# Patient Record
Sex: Female | Born: 1960 | Race: White | Hispanic: No | Smoking: Never smoker
Health system: Southern US, Community
[De-identification: ages and names within clinical notes are randomized; demographics above are authoritative.]

## PROBLEM LIST (undated history)

## (undated) DIAGNOSIS — R87619 Unspecified abnormal cytological findings in specimens from cervix uteri: Secondary | ICD-10-CM

## (undated) DIAGNOSIS — Z9889 Other specified postprocedural states: Secondary | ICD-10-CM

## (undated) DIAGNOSIS — R112 Nausea with vomiting, unspecified: Secondary | ICD-10-CM

## (undated) DIAGNOSIS — N83202 Unspecified ovarian cyst, left side: Secondary | ICD-10-CM

## (undated) DIAGNOSIS — T4145XA Adverse effect of unspecified anesthetic, initial encounter: Secondary | ICD-10-CM

## (undated) DIAGNOSIS — T8859XA Other complications of anesthesia, initial encounter: Secondary | ICD-10-CM

## (undated) DIAGNOSIS — J45909 Unspecified asthma, uncomplicated: Secondary | ICD-10-CM

## (undated) DIAGNOSIS — N939 Abnormal uterine and vaginal bleeding, unspecified: Secondary | ICD-10-CM

## (undated) HISTORY — DX: Abnormal uterine and vaginal bleeding, unspecified: N93.9

## (undated) HISTORY — DX: Unspecified ovarian cyst, left side: N83.202

## (undated) HISTORY — DX: Unspecified abnormal cytological findings in specimens from cervix uteri: R87.619

## (undated) HISTORY — PX: WISDOM TOOTH EXTRACTION: SHX21

## (undated) HISTORY — PX: TUBAL LIGATION: SHX77

---

## 2007-05-22 ENCOUNTER — Other Ambulatory Visit: Admission: RE | Admit: 2007-05-22 | Discharge: 2007-05-22 | Payer: Self-pay | Admitting: Obstetrics and Gynecology

## 2007-05-24 ENCOUNTER — Encounter: Admission: RE | Admit: 2007-05-24 | Discharge: 2007-05-24 | Payer: Self-pay | Admitting: Obstetrics and Gynecology

## 2007-06-05 ENCOUNTER — Encounter: Admission: RE | Admit: 2007-06-05 | Discharge: 2007-06-05 | Payer: Self-pay | Admitting: Obstetrics and Gynecology

## 2007-11-27 ENCOUNTER — Encounter: Admission: RE | Admit: 2007-11-27 | Discharge: 2007-11-27 | Payer: Self-pay | Admitting: Obstetrics and Gynecology

## 2008-05-23 ENCOUNTER — Other Ambulatory Visit: Admission: RE | Admit: 2008-05-23 | Discharge: 2008-05-23 | Payer: Self-pay | Admitting: Obstetrics and Gynecology

## 2008-06-21 ENCOUNTER — Encounter: Admission: RE | Admit: 2008-06-21 | Discharge: 2008-06-21 | Payer: Self-pay | Admitting: Obstetrics and Gynecology

## 2011-12-02 ENCOUNTER — Ambulatory Visit
Admission: RE | Admit: 2011-12-02 | Discharge: 2011-12-02 | Disposition: A | Discharge status: Home / Self Care | Payer: BC Managed Care – PPO | Source: Ambulatory Visit | Attending: Physical Medicine and Rehabilitation | Admitting: Physical Medicine and Rehabilitation

## 2011-12-02 ENCOUNTER — Other Ambulatory Visit: Payer: Self-pay | Admitting: Physical Medicine and Rehabilitation

## 2011-12-02 DIAGNOSIS — G894 Chronic pain syndrome: Secondary | ICD-10-CM

## 2011-12-02 DIAGNOSIS — M47817 Spondylosis without myelopathy or radiculopathy, lumbosacral region: Secondary | ICD-10-CM

## 2011-12-02 DIAGNOSIS — M5137 Other intervertebral disc degeneration, lumbosacral region: Secondary | ICD-10-CM

## 2011-12-02 DIAGNOSIS — M545 Low back pain: Secondary | ICD-10-CM

## 2017-03-08 DIAGNOSIS — R87619 Unspecified abnormal cytological findings in specimens from cervix uteri: Secondary | ICD-10-CM

## 2017-03-08 DIAGNOSIS — N83202 Unspecified ovarian cyst, left side: Secondary | ICD-10-CM

## 2017-03-08 HISTORY — DX: Unspecified ovarian cyst, left side: N83.202

## 2017-03-08 HISTORY — DX: Unspecified abnormal cytological findings in specimens from cervix uteri: R87.619

## 2017-05-27 ENCOUNTER — Ambulatory Visit: Payer: BC Managed Care – PPO | Admitting: Obstetrics and Gynecology

## 2017-05-27 ENCOUNTER — Encounter: Payer: Self-pay | Admitting: Obstetrics and Gynecology

## 2017-05-27 ENCOUNTER — Other Ambulatory Visit (HOSPITAL_COMMUNITY)
Admission: RE | Admit: 2017-05-27 | Discharge: 2017-05-27 | Disposition: A | Discharge status: Home / Self Care | Payer: BC Managed Care – PPO | Source: Ambulatory Visit | Attending: Obstetrics and Gynecology | Admitting: Obstetrics and Gynecology

## 2017-05-27 ENCOUNTER — Other Ambulatory Visit: Payer: Self-pay

## 2017-05-27 VITALS — BP 122/70 | HR 80 | Resp 16 | Ht 64.0 in | Wt 206.8 lb

## 2017-05-27 DIAGNOSIS — N950 Postmenopausal bleeding: Secondary | ICD-10-CM | POA: Diagnosis not present

## 2017-05-27 DIAGNOSIS — Z01419 Encounter for gynecological examination (general) (routine) without abnormal findings: Secondary | ICD-10-CM | POA: Diagnosis not present

## 2017-05-27 DIAGNOSIS — Z113 Encounter for screening for infections with a predominantly sexual mode of transmission: Secondary | ICD-10-CM

## 2017-05-27 DIAGNOSIS — Z1231 Encounter for screening mammogram for malignant neoplasm of breast: Secondary | ICD-10-CM

## 2017-05-27 DIAGNOSIS — Z23 Encounter for immunization: Secondary | ICD-10-CM | POA: Diagnosis not present

## 2017-05-27 NOTE — Progress Notes (Signed)
GYNECOLOGY  VISIT   HPI: 57 y.o.   Divorced  Caucasian  female   G93P2 with Patient's last menstrual period was 11/06/2013 (approximate).   here for postmenopausal bleeding.    Bleeding started 6 days ago and lasted 4 days.  Period cramping. Had breast tenderness the week before.   No other spotting.   She had irregular menses leading up to the final menstruation.  Had heavy cycles prior to this and there was discussion about ablation which the patient did not have.   Hot flashes are now gone.   Former patient of Dr. Joan Flores.   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   GYNECOLOGIC HISTORY: Patient's last menstrual period was 11/06/2013 (approximate). Contraception: Tubal/postmenopausal Menopausal hormone therapy:  none Last mammogram:  Years ago--normal Last pap smear:  Years ago--normal  Sexually active - female partner, no change. Colonoscopy - never.  Last tetanus - 9 - 10 years.  No regular exercise.   OB History    Gravida  2   Para  2   Term      Preterm      AB      Living  2     SAB      TAB      Ectopic      Multiple      Live Births                 There are no active problems to display for this patient.   Past Medical History:  Diagnosis Date  . Abnormal uterine bleeding     Past Surgical History:  Procedure Laterality Date  . TUBAL LIGATION      No current outpatient medications on file.   No current facility-administered medications for this visit.      ALLERGIES: Patient has no known allergies.  Family History  Problem Relation Age of Onset  . Congestive Heart Failure Mother   . Heart attack Father   . Cancer Brother        Dec 56 bile duct CA  . Cancer Maternal Grandmother        ?endometrial cancer  . Diabetes Brother   . Diabetes Brother     Social History   Socioeconomic History  . Marital status: Divorced    Spouse name: Not on file  . Number of children:  Not on file  . Years of education: Not on file  . Highest education level: Not on file  Occupational History  . Not on file  Social Needs  . Financial resource strain: Not on file  . Food insecurity:    Worry: Not on file    Inability: Not on file  . Transportation needs:    Medical: Not on file    Non-medical: Not on file  Tobacco Use  . Smoking status: Never Smoker  . Smokeless tobacco: Never Used  Substance and Sexual Activity  . Alcohol use: Yes    Alcohol/week: 1.2 oz    Types: 2 Glasses of wine per week  . Drug use: Never  . Sexual activity: Yes    Birth control/protection: Surgical, Post-menopausal    Comment: Tubal  Lifestyle  . Physical activity:    Days per week: Not on file    Minutes per session: Not on file  . Stress: Not on file  Relationships  . Social connections:    Talks on phone: Not on file    Gets  together: Not on file    Attends religious service: Not on file    Active member of club or organization: Not on file    Attends meetings of clubs or organizations: Not on file    Relationship status: Not on file  . Intimate partner violence:    Fear of current or ex partner: Not on file    Emotionally abused: Not on file    Physically abused: Not on file    Forced sexual activity: Not on file  Other Topics Concern  . Not on file  Social History Narrative  . Not on file    ROS:  Pertinent items are noted in HPI.  PHYSICAL EXAMINATION:    BP 122/70 (BP Location: Right Arm, Patient Position: Sitting, Cuff Size: Large)   Pulse 80   Resp 16   Ht 5\' 4"  (1.626 m)   Wt 206 lb 12.8 oz (93.8 kg)   LMP 11/06/2013 (Approximate)   BMI 35.50 kg/m     General appearance: alert, cooperative and appears stated age Head: Normocephalic, without obvious abnormality, atraumatic Neck: no adenopathy, supple, symmetrical, trachea midline and thyroid normal to inspection and palpation Lungs: clear to auscultation bilaterally Breasts: normal appearance, no masses  or tenderness, No nipple retraction or dimpling, No nipple discharge or bleeding, No axillary or supraclavicular adenopathy Heart: regular rate and rhythm Abdomen: soft, non-tender, no masses,  no organomegaly Extremities: extremities normal, atraumatic, no cyanosis or edema Skin: Skin color, texture, turgor normal. No rashes or lesions Lymph nodes: Cervical, supraclavicular, and axillary nodes normal. No abnormal inguinal nodes palpated Neurologic: Grossly normal  Pelvic: External genitalia:  no lesions              Urethra:  normal appearing urethra with no masses, tenderness or lesions              Bartholins and Skenes: normal                 Vagina: normal appearing vagina with tan colored vaginal drainage, no lesions              Cervix: no lesions.  Pap done.                 Bimanual Exam:  Uterus:  normal size, contour, position, consistency, mobility, non-tender              Adnexa: no mass, fullness, tenderness              Rectal exam: Yes.  .  Confirms.              Anus:  normal sphincter tone, no lesions  Chaperone was present for exam.  ASSESSMENT  Well woman visit with abnormal findings.  Postmenopausal bleeding.  FH uterine cancer.   PLAN  Discussed postmenopausal bleeding.  Routine labs, FSH, estradiol, hep C screening. Return for pelvic US and EMB.  She understands the importance to rule out cancer.  TDap today.  She declines colonoscopy.  Ca/Vit D guidelines. We will schedule a mammogram.    An After Visit Summary was printed and given to the patient.

## 2017-05-27 NOTE — Progress Notes (Signed)
Patient scheduled while in office for PUS with EMB on 06/02/17 at 9:30am, consult to follow at 10am with Dr. Quincy Simmonds. Advised to take Motrin 800 mg with food and water one hour before procedure.  Call placed to [facility], spoke with Manus Gunning. Patient scheduled for 2D screening MMG on 06/14/17 arriving at 3:40pm for 4pm appointment. Patient declined 3D screening. Patient verbalizes understanding and is agreeable.

## 2017-05-27 NOTE — Patient Instructions (Addendum)
EXERCISE AND DIET:  We recommended that you start or continue a regular exercise program for good health. Regular exercise means any activity that makes your heart beat faster and makes you sweat.  We recommend exercising at least 30 minutes per day at least 3 days a week, preferably 4 or 5.  We also recommend a diet low in fat and sugar.  Inactivity, poor dietary choices and obesity can cause diabetes, heart attack, stroke, and kidney damage, among others.    ALCOHOL AND SMOKING:  Women should limit their alcohol intake to no more than 7 drinks/beers/glasses of wine (combined, not each!) per week. Moderation of alcohol intake to this level decreases your risk of breast cancer and liver damage. And of course, no recreational drugs are part of a healthy lifestyle.  And absolutely no smoking or even second hand smoke. Most people know smoking can cause heart and lung diseases, but did you know it also contributes to weakening of your bones? Aging of your skin?  Yellowing of your teeth and nails?  CALCIUM AND VITAMIN D:  Adequate intake of calcium and Vitamin D are recommended.  The recommendations for exact amounts of these supplements seem to change often, but generally speaking 600 mg of calcium (either carbonate or citrate) and 800 units of Vitamin D per day seems prudent. Certain women may benefit from higher intake of Vitamin D.  If you are among these women, your doctor will have told you during your visit.    PAP SMEARS:  Pap smears, to check for cervical cancer or precancers,  have traditionally been done yearly, although recent scientific advances have shown that most women can have pap smears less often.  However, every woman still should have a physical exam from her gynecologist every year. It will include a breast check, inspection of the vulva and vagina to check for abnormal growths or skin changes, a visual exam of the cervix, and then an exam to evaluate the size and shape of the uterus and  ovaries.  And after 55 years of age, a rectal exam is indicated to check for rectal cancers. We will also provide age appropriate advice regarding health maintenance, like when you should have certain vaccines, screening for sexually transmitted diseases, bone density testing, colonoscopy, mammograms, etc.   MAMMOGRAMS:  All women over 40 years old should have a yearly mammogram. Many facilities now offer a "3D" mammogram, which may cost around $50 extra out of pocket. If possible,  we recommend you accept the option to have the 3D mammogram performed.  It both reduces the number of women who will be called back for extra views which then turn out to be normal, and it is better than the routine mammogram at detecting truly abnormal areas.    COLONOSCOPY:  Colonoscopy to screen for colon cancer is recommended for all women at age 50.  We know, you hate the idea of the prep.  We agree, BUT, having colon cancer and not knowing it is worse!!  Colon cancer so often starts as a polyp that can be seen and removed at colonscopy, which can quite literally save your life!  And if your first colonoscopy is normal and you have no family history of colon cancer, most women don't have to have it again for 10 years.  Once every ten years, you can do something that may end up saving your life, right?  We will be happy to help you get it scheduled when you are ready.    Be sure to check your insurance coverage so you understand how much it will cost.  It may be covered as a preventative service at no cost, but you should check your particular policy.     Postmenopausal Bleeding Postmenopausal bleeding is any bleeding a woman has after she has entered into menopause. Menopause is the end of a woman's fertile years. After menopause, a woman no longer ovulates or has menstrual periods. Postmenopausal bleeding can be caused by various things. Any type of postmenopausal bleeding, even if it appears to be a typical menstrual  period, is concerning. This should be evaluated by your health care provider. Any treatment will depend on the cause of the bleeding. Follow these instructions at home: Monitor your condition for any changes. The following actions may help to alleviate any discomfort you are experiencing:  Avoid the use of tampons and douches as directed by your health care provider.  Change your pads frequently.  Get regular pelvic exams and Pap tests.  Keep all follow-up appointments for diagnostic tests as directed by your health care provider.  Contact a health care provider if:  Your bleeding lasts more than 1 week.  You have abdominal pain.  You have bleeding with sexual intercourse. Get help right away if:  You have a fever, chills, headache, dizziness, muscle aches, and bleeding.  You have severe pain with bleeding.  You are passing blood clots.  You have bleeding and need more than 1 pad an hour.  You feel faint. This information is not intended to replace advice given to you by your health care provider. Make sure you discuss any questions you have with your health care provider. Document Released: 06/02/2005 Document Revised: 07/31/2015 Document Reviewed: 09/21/2012 Elsevier Interactive Patient Education  [employer].

## 2017-05-28 LAB — CBC
HEMATOCRIT: 38.8 % (ref 34.0–46.6)
Hemoglobin: 12.3 g/dL (ref 11.1–15.9)
MCH: 29.5 pg (ref 26.6–33.0)
MCHC: 31.7 g/dL (ref 31.5–35.7)
MCV: 93 fL (ref 79–97)
Platelets: 315 10*3/uL (ref 150–379)
RBC: 4.17 x10E6/uL (ref 3.77–5.28)
RDW: 14.5 % (ref 12.3–15.4)
WBC: 6.1 10*3/uL (ref 3.4–10.8)

## 2017-05-28 LAB — LIPID PANEL
Chol/HDL Ratio: 2.6 ratio (ref 0.0–4.4)
Cholesterol, Total: 207 mg/dL — ABNORMAL HIGH (ref 100–199)
HDL: 80 mg/dL (ref >39)
LDL Calculated: 106 mg/dL — ABNORMAL HIGH (ref 0–99)
TRIGLYCERIDES: 106 mg/dL (ref 0–149)
VLDL Cholesterol Cal: 21 mg/dL (ref 5–40)

## 2017-05-28 LAB — COMPREHENSIVE METABOLIC PANEL
A/G RATIO: 1.7 (ref 1.2–2.2)
ALK PHOS: 60 IU/L (ref 39–117)
ALT: 16 IU/L (ref 0–32)
AST: 18 IU/L (ref 0–40)
Albumin: 4.3 g/dL (ref 3.5–5.5)
BUN/Creatinine Ratio: 27 — ABNORMAL HIGH (ref 9–23)
BUN: 17 mg/dL (ref 6–24)
CO2: 25 mmol/L (ref 20–29)
Calcium: 8.8 mg/dL (ref 8.7–10.2)
Chloride: 105 mmol/L (ref 96–106)
Creatinine, Ser: 0.64 mg/dL (ref 0.57–1.00)
GFR calc Af Amer: 116 mL/min/{1.73_m2} (ref >59)
GFR calc non Af Amer: 101 mL/min/{1.73_m2} (ref >59)
GLOBULIN, TOTAL: 2.6 g/dL (ref 1.5–4.5)
Glucose: 89 mg/dL (ref 65–99)
POTASSIUM: 4.2 mmol/L (ref 3.5–5.2)
Sodium: 143 mmol/L (ref 134–144)
Total Protein: 6.9 g/dL (ref 6.0–8.5)

## 2017-05-28 LAB — ESTRADIOL: ESTRADIOL: 5.1 pg/mL

## 2017-05-28 LAB — HEPATITIS C ANTIBODY: Hep C Virus Ab: <0.1 s/co ratio (ref 0.0–0.9)

## 2017-05-28 LAB — TSH: TSH: 2.88 u[IU]/mL (ref 0.450–4.500)

## 2017-05-28 LAB — FOLLICLE STIMULATING HORMONE: FSH: 71.8 m[IU]/mL

## 2017-05-29 DIAGNOSIS — N950 Postmenopausal bleeding: Secondary | ICD-10-CM | POA: Insufficient documentation

## 2017-05-31 LAB — CYTOLOGY - PAP: HPV (WINDOPATH): NOT DETECTED

## 2017-06-01 ENCOUNTER — Telehealth: Payer: Self-pay

## 2017-06-01 NOTE — Telephone Encounter (Signed)
-----   Message from Nunzio Cobbs, MD sent at 05/29/2017 11:07 AM EDT ----- Please inform patient of results.  Her hormone testing indicates menopause, so it is important that she follow up as scheduled for her pelvic ultrasound and endometrial biopsy appointment.  Her thyroid, blood counts, and metabolic profile are all ok.  Hep C testing is negative.  Her cholesterol panel shows minimally elevated LDL cholesterol but her cholesterol ratios are good.  She can lower cholesterol through vigorous exercise and a diet low in saturated fats and cholesterol.

## 2017-06-01 NOTE — Telephone Encounter (Signed)
Left message to call Kaitlyn at [phone]. 

## 2017-06-01 NOTE — Telephone Encounter (Signed)
Spoke with patient. Results given. Patient verbalizes understanding. Encounter closed. 

## 2017-06-01 NOTE — Telephone Encounter (Signed)
Patient returned call to Kaitlyn. °

## 2017-06-02 ENCOUNTER — Ambulatory Visit (INDEPENDENT_AMBULATORY_CARE_PROVIDER_SITE_OTHER): Payer: BC Managed Care – PPO

## 2017-06-02 ENCOUNTER — Other Ambulatory Visit: Payer: Self-pay

## 2017-06-02 ENCOUNTER — Ambulatory Visit: Payer: BC Managed Care – PPO | Admitting: Obstetrics and Gynecology

## 2017-06-02 ENCOUNTER — Encounter: Payer: Self-pay | Admitting: Obstetrics and Gynecology

## 2017-06-02 VITALS — BP 112/80 | HR 72 | Resp 14 | Ht 64.0 in | Wt 206.0 lb

## 2017-06-02 DIAGNOSIS — N83202 Unspecified ovarian cyst, left side: Secondary | ICD-10-CM | POA: Diagnosis not present

## 2017-06-02 DIAGNOSIS — N950 Postmenopausal bleeding: Secondary | ICD-10-CM

## 2017-06-02 DIAGNOSIS — R87619 Unspecified abnormal cytological findings in specimens from cervix uteri: Secondary | ICD-10-CM | POA: Diagnosis not present

## 2017-06-02 NOTE — Progress Notes (Signed)
Encounter reviewed by Dr. Brallan Denio Amundson C. Silva.  

## 2017-06-02 NOTE — Progress Notes (Signed)
Patient scheduled while in office for colposcopy on 06/06/2017 at 3:30 pm with Dr.Silva. Patient is agreeable to date and time.

## 2017-06-02 NOTE — Progress Notes (Signed)
GYNECOLOGY  VISIT   HPI: 57 y.o.   Divorced  Caucasian  female   G61P2 with Patient's last menstrual period was 11/06/2013 (approximate).   here for ultrasound/EMB.  Postmenopausal bleeding.    GYNECOLOGIC HISTORY: Patient's last menstrual period was 11/06/2013 (approximate). Contraception:  Tubal/postmenopausal Menopausal hormone therapy:  none Last mammogram:  06/21/08 BIRADS 1 negative -- scheduled 06/14/17  Last pap smear:   05/27/17 AGUS and Endometrial cells present; HR HPV not detected        OB History    Gravida  2   Para  2   Term      Preterm      AB      Living  2     SAB      TAB      Ectopic      Multiple      Live Births                 Patient Active Problem List   Diagnosis Date Noted  . Postmenopausal bleeding 05/29/2017    Past Medical History:  Diagnosis Date  . Abnormal uterine bleeding     Past Surgical History:  Procedure Laterality Date  . TUBAL LIGATION      No current outpatient medications on file.   No current facility-administered medications for this visit.      ALLERGIES: Patient has no known allergies.  Family History  Problem Relation Age of Onset  . Congestive Heart Failure Mother   . Heart attack Father   . Cancer Brother        Dec 56 bile duct CA  . Cancer Maternal Grandmother        ?endometrial cancer  . Diabetes Brother   . Diabetes Brother     Social History   Socioeconomic History  . Marital status: Divorced    Spouse name: Not on file  . Number of children: Not on file  . Years of education: Not on file  . Highest education level: Not on file  Occupational History  . Not on file  Social Needs  . Financial resource strain: Not on file  . Food insecurity:    Worry: Not on file    Inability: Not on file  . Transportation needs:    Medical: Not on file    Non-medical: Not on file  Tobacco Use  . Smoking status: Never Smoker  . Smokeless tobacco: Never Used  Substance and Sexual  Activity  . Alcohol use: Yes    Alcohol/week: 1.2 oz    Types: 2 Glasses of wine per week  . Drug use: Never  . Sexual activity: Yes    Birth control/protection: Surgical, Post-menopausal    Comment: Tubal  Lifestyle  . Physical activity:    Days per week: Not on file    Minutes per session: Not on file  . Stress: Not on file  Relationships  . Social connections:    Talks on phone: Not on file    Gets together: Not on file    Attends religious service: Not on file    Active member of club or organization: Not on file    Attends meetings of clubs or organizations: Not on file    Relationship status: Not on file  . Intimate partner violence:    Fear of current or ex partner: Not on file    Emotionally abused: Not on file    Physically abused: Not on file  Forced sexual activity: Not on file  Other Topics Concern  . Not on file  Social History Narrative  . Not on file    ROS:  Pertinent items are noted in HPI.  PHYSICAL EXAMINATION:    BP 112/80 (BP Location: Right Arm, Patient Position: Sitting, Cuff Size: Large)   Pulse 72   Resp 14   Ht 5\' 4"  (1.626 m)   Wt 206 lb (93.4 kg)   LMP 11/06/2013 (Approximate)   BMI 35.36 kg/m     General appearance: alert, cooperative and appears stated age  Pelvic US Uterus with no myometrial masses. 9.02 mm EMS and cystic in nature. Right ovary with follicle.  Left ovary with cyst 63 x 39 x 40 mm and thickened septation 5 mm, avascular. No free fluid.   EMB performed under sterile conditions after consent and Hibiclens prep.  Tenaculum to anterior cx lip.  Os finder used.  Pipelle passed to almost 8 cm twice.  Tissue to pathology.  No complications.  Minimal EBL.   Chaperone was present for exam.  ASSESSMENT  Postmenopausal bleeding.  Thickened endometrium. AGUS pap. Left ovarian cyst with thickened septation.  PLAN  [facility] EMB.  Precautions given following EMB. CA125.  Discussed ultrasound findings of thickened  endometrium and left ovarian cyst. Discussed AGUS pap. Return for colposcopy.    An After Visit Summary was printed and given to the patient.  ___15___ minutes face to face time of which over 50% was spent in counseling.

## 2017-06-02 NOTE — Patient Instructions (Signed)
Endometrial Biopsy, Care After This sheet gives you information about how to care for yourself after your procedure. Your health care provider may also give you more specific instructions. If you have problems or questions, contact your health care provider. What can I expect after the procedure? After the procedure, it is common to have:  Mild cramping.  A small amount of vaginal bleeding for a few days. This is normal.  Follow these instructions at home:  Take over-the-counter and prescription medicines only as told by your health care provider.  Do not douche, use tampons, or have sexual intercourse until your health care provider approves.  Return to your normal activities as told by your health care provider. Ask your health care provider what activities are safe for you.  Follow instructions from your health care provider about any activity restrictions, such as restrictions on strenuous exercise or heavy lifting. Contact a health care provider if:  You have heavy bleeding, or bleed for longer than 2 days after the procedure.  You have bad smelling discharge from your vagina.  You have a fever or chills.  You have a burning sensation when urinating or you have difficulty urinating.  You have severe pain in your lower abdomen. Get help right away if:  You have severe cramps in your stomach or back.  You pass large blood clots.  Your bleeding increases.  You become weak or light-headed, or you pass out. Summary  After the procedure, it is common to have mild cramping and a small amount of vaginal bleeding for a few days.  Do not douche, use tampons, or have sexual intercourse until your health care provider approves.  Return to your normal activities as told by your health care provider. Ask your health care provider what activities are safe for you. This information is not intended to replace advice given to you by your health care provider. Make sure you discuss any  questions you have with your health care provider. Document Released: 12/13/2012 Document Revised: 03/10/2016 Document Reviewed: 03/10/2016 Elsevier Interactive Patient Education  2017 [place].   Colposcopy Colposcopy is a procedure to examine the lowest part of the uterus (cervix), the vagina, and the area around the vaginal opening (vulva) for abnormalities or signs of disease. The procedure is done using a lighted microscope or magnifying lens (colposcope). If any unusual cells are found during the procedure, your health care provider may remove a tissue sample for testing (biopsy). A colposcopy may be done if you:  Have an abnormal Pap test. A Pap test is a screening test that is used to check for signs of cancer or infection of the vagina, cervix, and uterus.  Have a Pap smear test in which you test positive for high-risk HPV (human papillomavirus).  Have a sore or lesion on your cervix.  Have genital warts on your vulva, vagina, or cervix.  Took certain medicines while pregnant, such as diethylstilbestrol (DES).  Have pain during sexual intercourse.  Have vaginal bleeding, especially after sexual intercourse.  Need to have a cervical polyp removed.  Need to have a lost intrauterine device (IUD) string located.  Let your health care provider know about:  Any allergies you have, including allergies to prescribed medicine, latex, or iodine.  All medicines you are taking, including vitamins, herbs, eye drops, creams, and over-the-counter medicines. Bring a list of all of your medicines to your appointment.  Any problems you or family members have had with anesthetic medicines.  Any blood disorders  you have.  Any surgeries you have had.  Any medical conditions you have, such as pelvic inflammatory disease (PID) or endometrial disorder.  Any history of frequent fainting.  Your menstrual cycle and what form of birth control (contraception) you use.  Your medical  history, including any prior cervical treatment.  Whether you are pregnant or may be pregnant. What are the risks? Generally, this is a safe procedure. However, problems may occur, including:  Pain.  Infection, which may include a fever, bad-smelling discharge, or pelvic pain.  Bleeding or discharge.  Misdiagnosis.  Fainting and vasovagal reactions, but this is rare.  Allergic reactions to medicines.  Damage to other structures or organs.  What happens before the procedure?  If you have your menstrual period or will have it at the time of your procedure, tell your health care provider. A colposcopy typically is not done during menstruation.  Continue your contraceptive practices before and after the procedure.  For 24 hours before the colposcopy: ? Do not douche. ? Do not use tampons. ? Do not use medicines, creams, or suppositories in the vagina. ? Do not have sexual intercourse.  Ask your health care provider about: ? Changing or stopping your regular medicines. This is especially important if you are taking diabetes medicines or blood thinners. ? Taking medicines such as aspirin and ibuprofen. These medicines can thin your blood. Do not take these medicines before your procedure if your health care provider instructs you not to. It is likely that your health care provider will tell you to avoid taking aspirin or medicine that contains aspirin for 7 days before the procedure.  Follow instructions from your health care provider about eating or drinking restrictions. You will likely need to eat a regular diet the day of the procedure and not skip any meals.  You may have an exam or testing. A pregnancy test will be taken on the day of the procedure.  You may have a blood or urine sample taken.  Plan to have someone take you home from the hospital or clinic.  If you will be going home right after the procedure, plan to have someone with you for 24 hours. What happens  during the procedure?  You will lie down on your back, with your feet in foot rests (stirrups).  A warmed and lubricated instrument (speculum) will be inserted into your vagina. The speculum will be used to hold apart the walls of your vagina so your health care provider can see your cervix and the inside of your vagina.  A cotton swab will be used to place a small amount of liquid solution on the areas to be examined. This solution makes it easier to see abnormal cells. You may feel a slight burning during this part.  The colposcope will be used to scan the cervix with a bright white light. The colposcope will be held near your vulvaand will magnify your vulva, vagina, and cervix for easier examination.  Your health care provider may decide to take a biopsy. If so: ? You may be given medicine to numb the area (local anesthetic). ? Surgical instruments will be used to suck out mucus and cells through your vagina. ? You may feel mild pain while the tissue sample is removed. ? Bleeding may occur. A solution may be used to stop the bleeding. ? If a sample of tissue is needed from the inside of the cervix, a different procedure called endocervical curettage (ECC) may be completed. During this  procedure, a curved instrument (curette) will be used to scrape cells from your cervix or the top of your cervix (endocervix).  Your health care provider will record the location of any abnormalities. The procedure may vary among health care providers and hospitals. What happens after the procedure?  You will lie down and rest for a few minutes. You may be offered juice or cookies.  Your blood pressure, heart rate, breathing rate, and blood oxygen level will be monitored until any medicines you were given have worn off.  You may have to wear compression stockings. These stockings help to prevent blood clots and reduce swelling in your legs.  You may have some cramping in your abdomen. This should go away  after a few minutes. This information is not intended to replace advice given to you by your health care provider. Make sure you discuss any questions you have with your health care provider. Document Released: 05/15/2002 Document Revised: 10/21/2015 Document Reviewed: 09/29/2015 Elsevier Interactive Patient Education  [employer].

## 2017-06-03 LAB — CA 125: Cancer Antigen (CA) 125: 12.4 U/mL (ref 0.0–38.1)

## 2017-06-06 ENCOUNTER — Encounter: Payer: Self-pay | Admitting: Obstetrics and Gynecology

## 2017-06-06 ENCOUNTER — Ambulatory Visit: Payer: BC Managed Care – PPO | Admitting: Obstetrics and Gynecology

## 2017-06-06 ENCOUNTER — Other Ambulatory Visit: Payer: Self-pay

## 2017-06-06 DIAGNOSIS — R87619 Unspecified abnormal cytological findings in specimens from cervix uteri: Secondary | ICD-10-CM

## 2017-06-06 NOTE — Patient Instructions (Signed)
Colposcopy, Care After  This sheet gives you information about how to care for yourself after your procedure. Your doctor may also give you more specific instructions. If you have problems or questions, contact your doctor.  What can I expect after the procedure?  If you did not have a tissue sample removed (did not have a biopsy), you may only have some spotting for a few days. You can go back to your normal activities.  If you had a tissue sample removed, it is common to have:  · Soreness and pain. This may last for a few days.  · Light-headedness.  · Mild bleeding from your vagina or dark-colored, grainy discharge from your vagina. This may last for a few days. You may need to wear a sanitary pad.  · Spotting for at least 48 hours after the procedure.    Follow these instructions at home:  · Take over-the-counter and prescription medicines only as told by your doctor. Ask your doctor what medicines you can start taking again. This is very important if you take blood-thinning medicine.  · Do not drive or use heavy machinery while taking prescription pain medicine.  · For 3 days, or as long as your doctor tells you, avoid:  ? Douching.  ? Using tampons.  ? Having sex.  · If you use birth control (contraception), keep using it.  · Limit activity for the first day after the procedure. Ask your doctor what activities are safe for you.  · It is up to you to get the results of your procedure. Ask your doctor when your results will be ready.  · Keep all follow-up visits as told by your doctor. This is important.  Contact a doctor if:  · You get a skin rash.  Get help right away if:  · You are bleeding a lot from your vagina. It is a lot of bleeding if you are using more than one pad an hour for 2 hours in a row.  · You have clumps of blood (blood clots) coming from your vagina.  · You have a fever.  · You have chills  · You have pain in your lower belly (pelvic area).  · You have signs of infection, such as vaginal  discharge that is:  ? Different than usual.  ? Yellow.  ? Bad-smelling.  · You have very pain or cramps in your lower belly that do not get better with medicine.  · You feel light-headed.  · You feel dizzy.  · You pass out (faint).  Summary  · If you did not have a tissue sample removed (did not have a biopsy), you may only have some spotting for a few days. You can go back to your normal activities.  · If you had a tissue sample removed, it is common to have mild pain and spotting for 48 hours.  · For 3 days, or as long as your doctor tells you, avoid douching, using tampons and having sex.  · Get help right away if you have bleeding, very bad pain, or signs of infection.  This information is not intended to replace advice given to you by your health care provider. Make sure you discuss any questions you have with your health care provider.  Document Released: 08/11/2007 Document Revised: 11/12/2015 Document Reviewed: 11/12/2015  Elsevier Interactive Patient Education © 2018 Elsevier Inc[employer]

## 2017-06-06 NOTE — Progress Notes (Signed)
Subjective:     Patient ID: Alyssa Chavez, female   DOB: Jul 13, 1960, 57 y.o.   MRN: [id number]  HPI  Pap History: 05/27/17 AGUS and Endometrial cells present; HR HPV not detected.  Currently also being seen for postmenopausal bleeding. EMB benign polypoid endometrium.  Pelvic US showing thickened endometrium, 9.02 mm and cystic, and a left ovarian cyst 63 x 39 x 40 mm with thickened 5 mm septation, avascular.  Normal CA125.   Review of Systems LMP: 11/06/13 Contraception: Tubal/contraception Patient states that she has taken 800mg  of Ibuprofen around 2:30pm     Objective:   Physical Exam  Genitourinary:    Colposcopy Consent for procedure.  3% acetic acid used.  Bleeding from the right cervix at 9:00 just inside the os making it difficult to do a satisfactory colposcopy even with use of endocervical specimen. White light and Wence light filter used.  ECC taken and sent to pathology.  Lugol's applied and had decreased uptake along lower cervix.   Biopsy at 6:00 exocervix sent to pathology.  Biopsy at 9:00 exocervix sent to pathology.  Monsel's applied.  AgNO3 applied to cervix at 9:00. Minimal EBL.  No complications.       Assessment:     AGUS pap.  Negative HR HPV.  Postmenopausal bleeding.  Thickened endometrium. Left ovarian cyst with thickened septation.  Normal CA125.      Plan:     Follow up biopsy results.  Instructions/precautions given.  We discussed potential for future surgical care - LEEP, hysteroscopy, oophorectomy, and even hysterectomy.  We will put together a comprehensive plan when the colposcopy results are available. She understands that there are protocols for proper evaluation of all of these diagnoses.  After visit summary to patient.

## 2017-06-10 ENCOUNTER — Encounter: Payer: Self-pay | Admitting: Obstetrics and Gynecology

## 2017-06-14 ENCOUNTER — Ambulatory Visit
Admission: RE | Admit: 2017-06-14 | Discharge: 2017-06-14 | Disposition: A | Discharge status: Home / Self Care | Payer: BC Managed Care – PPO | Source: Ambulatory Visit | Attending: Obstetrics and Gynecology | Admitting: Obstetrics and Gynecology

## 2017-06-14 DIAGNOSIS — Z1231 Encounter for screening mammogram for malignant neoplasm of breast: Secondary | ICD-10-CM

## 2017-06-15 ENCOUNTER — Encounter: Payer: Self-pay | Admitting: Obstetrics and Gynecology

## 2017-06-15 ENCOUNTER — Ambulatory Visit: Payer: BC Managed Care – PPO | Admitting: Obstetrics and Gynecology

## 2017-06-15 ENCOUNTER — Other Ambulatory Visit: Payer: Self-pay

## 2017-06-15 VITALS — BP 112/64 | HR 66 | Ht 64.0 in | Wt 208.0 lb

## 2017-06-15 DIAGNOSIS — N870 Mild cervical dysplasia: Secondary | ICD-10-CM | POA: Diagnosis not present

## 2017-06-15 DIAGNOSIS — N950 Postmenopausal bleeding: Secondary | ICD-10-CM

## 2017-06-15 DIAGNOSIS — N83202 Unspecified ovarian cyst, left side: Secondary | ICD-10-CM | POA: Diagnosis not present

## 2017-06-15 DIAGNOSIS — N840 Polyp of corpus uteri: Secondary | ICD-10-CM | POA: Diagnosis not present

## 2017-06-15 NOTE — Patient Instructions (Signed)
I will be in touch with you after I contact Dr. Everitt Amber about your potential surgical care.

## 2017-06-15 NOTE — Progress Notes (Signed)
GYNECOLOGY  VISIT   HPI: 57 y.o.   Divorced  Caucasian  female   G71P2 with Patient's last menstrual period was 11/06/2013 (approximate).   here for consult.  No further vaginal bleeding.   Has completed a work up for postmenopausal bleeding and AGUS pap showing endometrial cells. US showed cystic endometrium and left ovarian cyst 63 x 39 x 40 mm with multiple septations, thickest 5 mm and scant debris noted.  CA125 12.4. EMB benign polypoid endometrium. Colpo biopsies benign ECC and bx showing atypia.  One area of cervix at 9:00 transformation zone not seen well due to bleeding at colpo.  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  Works as a Engineer, site.  No lifting.   GYNECOLOGIC HISTORY: Patient's last menstrual period was 11/06/2013 (approximate). Contraception:  Tubal Menopausal hormone therapy:  none Last mammogram:  06/14/17 - not resulted yet Last pap smear:  06/06/17 Colposcopy showing atypia, HPV effect, and low grad dysplasia; 3/22/19AGUS andEndometrial cells present; HR HPV not detected         OB History    Gravida  2   Para  2   Term      Preterm      AB      Living  2     SAB      TAB      Ectopic      Multiple      Live Births                 Patient Active Problem List   Diagnosis Date Noted  . 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 Left ovarian cyst 06/02/2017  . Postmenopausal bleeding 05/29/2017    Past Medical History:  Diagnosis Date  . Abnormal uterine bleeding   . 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 Left ovarian cyst 2019   septations    Past Surgical History:  Procedure Laterality Date  . TUBAL LIGATION      No current outpatient medications on file.   No current facility-administered medications for this visit.      ALLERGIES:  Patient has no known allergies.  Family History  Problem Relation Age of Onset  . Congestive Heart Failure Mother   . Heart attack Father   . Cancer Brother        Dec 56 bile duct CA  . Cancer Maternal Grandmother        ?endometrial cancer  . Diabetes Brother   . Diabetes Brother     Social History   Socioeconomic History  . Marital status: Divorced    Spouse name: Not on file  . Number of children: Not on file  . Years of education: Not on file  . Highest education level: Not on file  Occupational History  . Not on file  Social Needs  . Financial resource strain: Not on file  . Food insecurity:    Worry: Not on file    Inability: Not on file  . Transportation needs:    Medical: Not on file    Non-medical: Not on file  Tobacco Use  . Smoking status: Never Smoker  . Smokeless tobacco: Never Used  Substance and Sexual Activity  . Alcohol use: Yes    Alcohol/week: 1.2 oz    Types: 2 Glasses of wine per week  . Drug use:  Never  . Sexual activity: Yes    Birth control/protection: Surgical, Post-menopausal    Comment: Tubal  Lifestyle  . Physical activity:    Days per week: Not on file    Minutes per session: Not on file  . Stress: Not on file  Relationships  . Social connections:    Talks on phone: Not on file    Gets together: Not on file    Attends religious service: Not on file    Active member of club or organization: Not on file    Attends meetings of clubs or organizations: Not on file    Relationship status: Not on file  . Intimate partner violence:    Fear of current or ex partner: Not on file    Emotionally abused: Not on file    Physically abused: Not on file    Forced sexual activity: Not on file  Other Topics Concern  . Not on file  Social History Narrative  . Not on file    ROS:  Pertinent items are noted in HPI.  PHYSICAL EXAMINATION:    BP 112/64 (BP Location: Right Arm, Patient Position: Sitting, Cuff Size: Large)   Pulse 66   Ht  5\' 4"  (1.626 m)   Wt 208 lb (94.3 kg)   LMP 11/06/2013 (Approximate)   BMI 35.70 kg/m     General appearance: alert, cooperative and appears stated age  ASSESSMENT  Postmenopausal bleeding.   Benign polypoid endometrium. AGUS pap.  Left ovarian cyst with septations.  Normal CA125.  MGM with hx uterine cancer?  PLAN  We reviewed her work up and care options:  Hysteroscopy with dilation and curettage and laparoscopic left salpingo-oophorectomy and pelvic washings, robotic total laparoscopic hysterectomy with bilateral salpingo-oophorectomy, or referral to [facility] for evaluation and treatment.  Patient would like to proceed with definitive surgical care.  She understands if I were to do surgery and she were to have a hidden cancer, that she could need additional surgical staging.  I will reach out to Dr. Everitt Amber from [facility] to get her opinion about proceeding forward.  Written information about hysterectomy and robotic surgery to patient.   An After Visit Summary was printed and given to the patient.  __25____ minutes face to face time of which over 50% was spent in counseling.

## 2017-06-23 ENCOUNTER — Telehealth: Payer: Self-pay | Admitting: Obstetrics and Gynecology

## 2017-06-23 NOTE — Telephone Encounter (Signed)
Call to patient. Advised of update from Dr Quincy Simmonds and Dr Denman George. Patient desires to proceed with surgery with Dr Quincy Simmonds. Unsure when she can schedule. Partner's father passed away suddenly and she has had to travel out of state.  She is with boyfriend/partner and is unsure how long it will be until they can come home and schedule. Discussed need to proceed without prolonged delay due to nature of PMB.  Discussed date of 07-12-17 and she will consider this and call back beginning of week with update and possible plan.

## 2017-06-23 NOTE — Telephone Encounter (Signed)
Please contact patient in follow up to Dr. Serita Grit message which is attached. Dr. Denman George agrees that cancer risk is low but not zero. She may proceed with me performing her surgery.   I am recommending we proceed with a total laparoscopic hysterectomy with bilateral salpingo-oophorectomy, collection of pelvic washings, and cystoscopy.   This may be performed with or without robotic assistance.   Diagnoses:  Postmenopausal bleeding.  Polypoid endometrium.  AGUS pap with LGSIL on colposcopic biopsy. Complex left ovarian cyst.

## 2017-06-23 NOTE — Telephone Encounter (Signed)
-----   Message from Everitt Amber, MD sent at 06/21/2017  6:13 PM EDT ----- Dear Dr Quincy Simmonds, I had heard that you wanted me to weigh in on this patient. I'm so sorry that I didn't find your inbox message to me. I reviewed your notes from 4/11. I think that there is probably a low chance for cancer. Obviously I cannot give guarantees. But I would counsel the patient that I had a low suspicion preoperatively based on the info you've gathered. Hope this helps. Terrence Dupont

## 2017-06-28 NOTE — Telephone Encounter (Signed)
Please keep this phone encounter open until we have a resolution of a date for surgery.  Cc- Alyssa Chavez

## 2017-06-28 NOTE — Telephone Encounter (Signed)
Spoke with patient regarding benefit for surgery. Patient understood and agreeable. Patient aware this is professional benefit only. Patient aware will be contacted by hospital for separate benefits, once surgery has been scheduled. Patient advises she is waiting to hear back from her employer to see if she can take time off, she needs to verify her position will be protected during her recovery, as she has already exhausted her FMLA. Patient advises she will call beck when is ready to proceed.  Routing to Dr Quincy Simmonds  cc: Lamont Snowball, RN

## 2017-06-29 ENCOUNTER — Encounter: Payer: Self-pay | Admitting: *Deleted

## 2017-06-29 NOTE — Telephone Encounter (Signed)
Call to patient. Confirmed will proceed with 07-12-17 surgery date. Will schedule and call back once confirmed.

## 2017-06-29 NOTE — Progress Notes (Signed)
Surgery letter

## 2017-06-29 NOTE — Telephone Encounter (Signed)
Patient returning call. The 7th of May will work for surgery if that date is still available.

## 2017-06-29 NOTE — Telephone Encounter (Signed)
Call to patient. Surgery confirmed for 07-12-17 at 0730 at [facility]. Consult appointment scheduled for tomorrow at 1200 with Dr Quincy Simmonds. Will provide surgery instructions at that appointment.  Routing to provider for final review. Patient agreeable to disposition. Will close encounter.

## 2017-06-30 ENCOUNTER — Other Ambulatory Visit: Payer: Self-pay

## 2017-06-30 ENCOUNTER — Encounter: Payer: Self-pay | Admitting: Obstetrics and Gynecology

## 2017-06-30 ENCOUNTER — Ambulatory Visit: Payer: BC Managed Care – PPO | Admitting: Obstetrics and Gynecology

## 2017-06-30 VITALS — BP 118/72 | HR 68 | Resp 16 | Ht 64.0 in | Wt 207.0 lb

## 2017-06-30 DIAGNOSIS — N83202 Unspecified ovarian cyst, left side: Secondary | ICD-10-CM

## 2017-06-30 DIAGNOSIS — N950 Postmenopausal bleeding: Secondary | ICD-10-CM

## 2017-06-30 DIAGNOSIS — N840 Polyp of corpus uteri: Secondary | ICD-10-CM

## 2017-06-30 NOTE — Progress Notes (Signed)
GYNECOLOGY  VISIT   HPI: 57 y.o.   Divorced  Caucasian  female   G61P2 with Patient's last menstrual period was 11/06/2013 (approximate).   here for surgery consult.  Presented with postmenopausal bleeding.  Has completed a work up for postmenopausal bleeding and AGUS pap showing endometrial cells. US showed cystic endometrium and left ovarian cyst 63 x 39 x 40 mm with multiple septations, thickest 5 mm and scant debris noted.  CA125 12.4. EMB benign polypoid endometrium. Colpo biopsies benign ECC and bx showing atypia.  One area of cervix at 9:00 transformation zone not seen well due to bleeding at colpo.  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  Dr. Denman George in agreement that risk of malignancy is low enough that it is ok for benign gynecologist to proceed with patient's surgery.  Father in law passed recently.   GYNECOLOGIC HISTORY: Patient's last menstrual period was 11/06/2013 (approximate). Contraception:  Tubal Menopausal hormone therapy:  none Last mammogram:  06/15/17 BIRADS 1 negative/denisty b Last pap smear:   06/06/17 Colposcopy showing atypia, HPV effect, and low grad dysplasia; 3/22/19AGUS andEndometrial cells present; HR HPV not detected        OB History    Gravida  2   Para  2   Term      Preterm      AB      Living  2     SAB      TAB      Ectopic      Multiple      Live Births                 Patient Active Problem List   Diagnosis Date Noted  . 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 Left ovarian cyst 06/02/2017  . Postmenopausal bleeding 05/29/2017    Past Medical History:  Diagnosis Date  . Abnormal uterine bleeding   . 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 Left ovarian cyst 2019   septations    Past Surgical History:  Procedure Laterality Date  . TUBAL LIGATION       No current outpatient medications on file.   No current facility-administered medications for this visit.      ALLERGIES: Patient has no known allergies.  Family History  Problem Relation Age of Onset  . Congestive Heart Failure Mother   . Heart attack Father   . Cancer Brother        Dec 56 bile duct CA  . Cancer Maternal Grandmother        ?endometrial cancer  . Diabetes Brother   . Diabetes Brother     Social History   Socioeconomic History  . Marital status: Divorced    Spouse name: Not on file  . Number of children: Not on file  . Years of education: Not on file  . Highest education level: Not on file  Occupational History  . Not on file  Social Needs  . Financial resource strain: Not on file  . Food insecurity:    Worry: Not on file    Inability: Not on file  . Transportation needs:    Medical: Not on file    Non-medical: Not on file  Tobacco Use  . Smoking status: Never Smoker  . Smokeless tobacco: Never Used  Substance and Sexual Activity  . Alcohol use: Yes  Alcohol/week: 1.2 oz    Types: 2 Glasses of wine per week  . Drug use: Never  . Sexual activity: Yes    Birth control/protection: Surgical, Post-menopausal    Comment: Tubal  Lifestyle  . Physical activity:    Days per week: Not on file    Minutes per session: Not on file  . Stress: Not on file  Relationships  . Social connections:    Talks on phone: Not on file    Gets together: Not on file    Attends religious service: Not on file    Active member of club or organization: Not on file    Attends meetings of clubs or organizations: Not on file    Relationship status: Not on file  . Intimate partner violence:    Fear of current or ex partner: Not on file    Emotionally abused: Not on file    Physically abused: Not on file    Forced sexual activity: Not on file  Other Topics Concern  . Not on file  Social History Narrative  . Not on file    ROS:  Pertinent items are noted in  HPI.  PHYSICAL EXAMINATION:    BP 118/72 (BP Location: Right Arm, Patient Position: Sitting, Cuff Size: Large)   Pulse 68   Resp 16   Ht 5\' 4"  (1.626 m)   Wt 207 lb (93.9 kg)   LMP 11/06/2013 (Approximate)   BMI 35.53 kg/m     General appearance: alert, cooperative and appears stated age Head: Normocephalic, without obvious abnormality, atraumatic Neck: no adenopathy, supple, symmetrical, trachea midline and thyroid normal to inspection and palpation Lungs: clear to auscultation bilaterally Heart: regular rate and rhythm Abdomen: umbilical incision, abdomen is soft, non-tender, no masses,  no organomegaly Extremities: extremities normal, atraumatic, no cyanosis or edema Skin: Skin color, texture, turgor normal. No rashes or lesions Lymph nodes: Cervical, supraclavicular, and axillary nodes normal. No abnormal inguinal nodes palpated Neurologic: Grossly normal  Pelvic: External genitalia:  no lesions              Urethra:  normal appearing urethra with no masses, tenderness or lesions              Bartholins and Skenes: normal                 Vagina: normal appearing vagina with normal color and discharge, no lesions              Cervix: no lesions                Bimanual Exam:  Uterus:  normal size, contour, position, consistency, mobility, non-tender              Adnexa: no mass, fullness, tenderness              Rectal exam: Yes.  .  Confirms.              Anus:  normal sphincter tone, no lesions  Chaperone was present for exam.  ASSESSMENT   Postmenopausal bleeding.   Benign polypoid endometrium. AGUS pap.  Left ovarian cyst with septations.  Normal CA125.  MGM with hx uterine cancer?  PLAN  I discussed total laparoscopic hysterectomy with bilateral salpingo-oophorectomy, used of an intraperitoneal bag for at least ovarian specimen, and collection of pelvic washings. I reviewed  Risks, benefits, and alternatives.  Risks include but are not limited to bleeding,  infection, damage to surrounding organs, pneumonia, reaction to  anesthesia, DVT, PE, death, need for reoperation, hernia formation, vaginal cuff dehiscence, need to convert to a traditional laparotomy incision to complete the procedure, and surgical staging if a cancer were found on final surgical specimen.  Patient wishes to proceed.  Bowel prep not needed. Surgical expectations and recovery discussed.   An After Visit Summary was printed and given to the patient.

## 2017-07-01 NOTE — Patient Instructions (Signed)
Alyssa Chavez  07/01/2017      Your procedure is scheduled on 07-12-17   Report to [place]  at  5:30 A.M.  Call this number if you have problems the morning of [phone]  OUR ADDRESS IS [place], WE ARE LOCATED IN THE [facility]ICAL [facility] WITH [facility].   Remember:  Do not eat food or drink liquids after midnight.  Take these medicines the morning of surgery with A SIP OF WATER: None    Do not wear jewelry, make-up or nail polish.  Do not wear lotions, powders, or perfumes, or deoderant.  Do not shave 48 hours prior to surgery.   Do not bring valuables to the hospital.  [facility] is not responsible for any belongings or valuables.  Contacts, dentures or bridgework may not be worn into surgery.  Leave your suitcase in the car.  After surgery it may be brought to your room.  For patients admitted to the hospital, discharge time will be determined by your treatment team.    Special instructions:   Please read over the following fact sheets that you were given:       [facility] - Preparing for Surgery Before surgery, you can play an important role.  Because skin is not sterile, your skin needs to be as free of germs as possible.  You can reduce the number of germs on your skin by washing with CHG (chlorahexidine gluconate) soap before surgery.  CHG is an antiseptic cleaner which kills germs and bonds with the skin to continue killing germs even after washing. Please DO NOT use if you have an allergy to CHG or antibacterial soaps.  If your skin becomes reddened/irritated stop using the CHG and inform your nurse when you arrive at Short Stay. Do not shave (including legs and underarms) for at least 48 hours prior to the first CHG shower.  You may shave your face/neck. Please follow these instructions carefully:  1.  Shower with CHG Soap the night before surgery and the  morning of Surgery.  2.  If you choose to wash your hair, wash your  hair first as usual with your  normal  shampoo.  3.  After you shampoo, rinse your hair and body thoroughly to remove the  shampoo.                           4.  Use CHG as you would any other liquid soap.  You can apply chg directly  to the skin and wash                       Gently with a scrungie or clean washcloth.  5.  Apply the CHG Soap to your body ONLY FROM THE NECK DOWN.   Do not use on face/ open                           Wound or open sores. Avoid contact with eyes, ears mouth and genitals (private parts).                       Wash face,  Genitals (private parts) with your normal soap.             6.  Wash thoroughly, paying special attention to the area where your surgery  will be performed.  7.  Thoroughly rinse your body with warm water from the neck down.  8.  DO NOT shower/wash with your normal soap after using and rinsing off  the CHG Soap.                9.  Pat yourself dry with a clean towel.            10.  Wear clean pajamas.            11.  Place clean sheets on your bed the night of your first shower and do not  sleep with pets. Day of Surgery : Do not apply any lotions/deodorants the morning of surgery.  Please wear clean clothes to the hospital/surgery center.  FAILURE TO FOLLOW THESE INSTRUCTIONS MAY RESULT IN THE CANCELLATION OF YOUR SURGERY PATIENT SIGNATURE_________________________________  NURSE SIGNATURE__________________________________  ________________________________________________________________________

## 2017-07-04 ENCOUNTER — Encounter (HOSPITAL_COMMUNITY)
Admission: RE | Admit: 2017-07-04 | Discharge: 2017-07-04 | Disposition: A | Discharge status: Home / Self Care | Payer: BC Managed Care – PPO | Source: Ambulatory Visit | Attending: Obstetrics and Gynecology | Admitting: Obstetrics and Gynecology

## 2017-07-04 ENCOUNTER — Encounter (HOSPITAL_COMMUNITY): Payer: Self-pay

## 2017-07-04 ENCOUNTER — Other Ambulatory Visit: Payer: Self-pay | Admitting: Obstetrics and Gynecology

## 2017-07-04 ENCOUNTER — Other Ambulatory Visit: Payer: Self-pay

## 2017-07-04 DIAGNOSIS — Z01812 Encounter for preprocedural laboratory examination: Secondary | ICD-10-CM | POA: Diagnosis not present

## 2017-07-04 DIAGNOSIS — N950 Postmenopausal bleeding: Secondary | ICD-10-CM | POA: Diagnosis not present

## 2017-07-04 DIAGNOSIS — N879 Dysplasia of cervix uteri, unspecified: Secondary | ICD-10-CM | POA: Insufficient documentation

## 2017-07-04 DIAGNOSIS — N840 Polyp of corpus uteri: Secondary | ICD-10-CM | POA: Insufficient documentation

## 2017-07-04 DIAGNOSIS — R7309 Other abnormal glucose: Secondary | ICD-10-CM

## 2017-07-04 HISTORY — DX: Other specified postprocedural states: R11.2

## 2017-07-04 HISTORY — DX: Unspecified asthma, uncomplicated: J45.909

## 2017-07-04 HISTORY — DX: Adverse effect of unspecified anesthetic, initial encounter: T41.45XA

## 2017-07-04 HISTORY — DX: Nausea with vomiting, unspecified: Z98.890

## 2017-07-04 HISTORY — DX: Other complications of anesthesia, initial encounter: T88.59XA

## 2017-07-04 LAB — CBC
HEMATOCRIT: 38.3 % (ref 36.0–46.0)
HEMOGLOBIN: 12.2 g/dL (ref 12.0–15.0)
MCH: 28.8 pg (ref 26.0–34.0)
MCHC: 31.9 g/dL (ref 30.0–36.0)
MCV: 90.5 fL (ref 78.0–100.0)
Platelets: 247 10*3/uL (ref 150–400)
RBC: 4.23 MIL/uL (ref 3.87–5.11)
RDW: 13.5 % (ref 11.5–15.5)
WBC: 6.6 10*3/uL (ref 4.0–10.5)

## 2017-07-04 LAB — BASIC METABOLIC PANEL
ANION GAP: 8 (ref 5–15)
BUN: 24 mg/dL — ABNORMAL HIGH (ref 6–20)
CALCIUM: 9 mg/dL (ref 8.9–10.3)
CO2: 24 mmol/L (ref 22–32)
Chloride: 109 mmol/L (ref 101–111)
Creatinine, Ser: 0.66 mg/dL (ref 0.44–1.00)
GFR calc non Af Amer: >60 mL/min (ref >60)
Glucose, Bld: 104 mg/dL — ABNORMAL HIGH (ref 65–99)
POTASSIUM: 5.3 mmol/L — AB (ref 3.5–5.1)
SODIUM: 141 mmol/L (ref 135–145)

## 2017-07-04 LAB — ABO/RH: ABO/RH(D): A NEG

## 2017-07-04 NOTE — Progress Notes (Signed)
07-04-17 BMP result routed to Dr. Quincy Simmonds for review.

## 2017-07-06 ENCOUNTER — Other Ambulatory Visit (INDEPENDENT_AMBULATORY_CARE_PROVIDER_SITE_OTHER): Payer: BC Managed Care – PPO

## 2017-07-06 DIAGNOSIS — R7309 Other abnormal glucose: Secondary | ICD-10-CM

## 2017-07-06 LAB — HEMOGLOBIN A1C
Est. average glucose Bld gHb Est-mCnc: 114 mg/dL
HEMOGLOBIN A1C: 5.6 % (ref 4.8–5.6)

## 2017-07-06 LAB — POTASSIUM: POTASSIUM: 4.7 mmol/L (ref 3.5–5.2)

## 2017-07-11 NOTE — H&P (Signed)
Office Visit   06/30/2017 Alyssa Chavez, Alyssa All, MD  Obstetrics and Gynecology   Postmenopausal bleeding +2 more  Dx   Office Visit    Reason for Visit   Additional Documentation   Vitals:   BP 118/72 (BP Location: Right Arm, Patient Position: Sitting, Cuff Size: Large)   Pulse 68   Resp 16   Ht 5\' 4"  (1.626 m)   Wt 207 lb (93.9 kg)   LMP 11/06/2013 (Approximate)   BMI 35.53 kg/m   BSA 2.06 m      More Vitals   Flowsheets:   MEWS Score,   Anthropometrics     Encounter Info:   Billing Info,   History,   Allergies,   Detailed Report     Chavez Notes   Progress Notes by Alyssa Cobbs, MD at 06/30/2017 12:00 PM  Author: Nunzio Cobbs, MD Author Type: Physician Filed: 06/30/2017 12:39 PM  Note Status: Signed Cosign: Cosign Not Required Encounter Date: 06/30/2017  Editor: Alyssa Cobbs, MD (Physician)  Prior Versions: 1. Alyssa Chavez CMA (Certified Psychologist, sport and exercise) at 06/30/2017 11:58 AM - Sign at close encounter    GYNECOLOGY  VISIT   HPI: 57 y.o.   Divorced  Caucasian  female   G32P2 with Patient's last menstrual period was 11/06/2013 (approximate).   here for surgery consult.  Presented with postmenopausal bleeding.  Has completed a work up for postmenopausal bleedingandAGUS papshowing endometrial cells. US showed cystic endometrium and left ovarian cyst 63 x 39 x 40 mm with multiple septations, thickest 5 mm and scant debris noted.  CA125 12.4. EMB benign polypoid endometrium. Colpo biopsies benign ECC and bx showing atypia. One area of cervix at 9:00 transformation zone not seen well due to bleeding at colpo. 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  Dr. Denman Chavez in agreement that risk of malignancy is low enough that it is ok for benign gynecologist to proceed with patient's surgery.  Father in law passed recently.   GYNECOLOGIC HISTORY: Patient's last  menstrual period was 11/06/2013 (approximate). Contraception:  Tubal Menopausal hormone therapy:  none Last mammogram:  06/15/17 BIRADS 1 negative/denisty b Last pap smear:   06/06/17 Colposcopy showing atypia, HPV effect, and low grad dysplasia;3/22/19AGUS andEndometrial cells present; HR HPV not detected                OB History    Gravida  2   Para  2   Term      Preterm      AB      Living  2     SAB      TAB      Ectopic      Multiple      Live Births                     Patient Active Problem List   Diagnosis Date Noted  . 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 Left ovarian cyst 06/02/2017  . Postmenopausal bleeding 05/29/2017        Past Medical History:  Diagnosis Date  . Abnormal uterine bleeding   . 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 Left ovarian cyst 2019   septations         Past  Surgical History:  Procedure Laterality Date  . TUBAL LIGATION      No current outpatient medications on file.   No current facility-administered medications for this visit.      ALLERGIES: Patient has no known allergies.       Family History  Problem Relation Age of Onset  . Congestive Heart Failure Mother   . Heart attack Father   . Cancer Brother        Dec 56 bile duct CA  . Cancer Maternal Grandmother        ?endometrial cancer  . Diabetes Brother   . Diabetes Brother     Social History        Socioeconomic History  . Marital status: Divorced    Spouse name: Not on file  . Number of children: Not on file  . Years of education: Not on file  . Highest education level: Not on file  Occupational History  . Not on file  Social Needs  . Financial resource strain: Not on file  . Food insecurity:    Worry: Not on file    Inability: Not on file  .  Transportation needs:    Medical: Not on file    Non-medical: Not on file  Tobacco Use  . Smoking status: Never Smoker  . Smokeless tobacco: Never Used  Substance and Sexual Activity  . Alcohol use: Yes    Alcohol/week: 1.2 oz    Types: 2 Glasses of wine per week  . Drug use: Never  . Sexual activity: Yes    Birth control/protection: Surgical, Post-menopausal    Comment: Tubal  Lifestyle  . Physical activity:    Days per week: Not on file    Minutes per session: Not on file  . Stress: Not on file  Relationships  . Social connections:    Talks on phone: Not on file    Gets together: Not on file    Attends religious service: Not on file    Active member of club or organization: Not on file    Attends meetings of clubs or organizations: Not on file    Relationship status: Not on file  . Intimate partner violence:    Fear of current or ex partner: Not on file    Emotionally abused: Not on file    Physically abused: Not on file    Forced sexual activity: Not on file  Other Topics Concern  . Not on file  Social History Narrative  . Not on file    ROS:  Pertinent items are noted in HPI.  PHYSICAL EXAMINATION:    BP 118/72 (BP Location: Right Arm, Patient Position: Sitting, Cuff Size: Large)   Pulse 68   Resp 16   Ht 5\' 4"  (1.626 m)   Wt 207 lb (93.9 kg)   LMP 11/06/2013 (Approximate)   BMI 35.53 kg/m     General appearance: alert, cooperative and appears stated age Head: Normocephalic, without obvious abnormality, atraumatic Neck: no adenopathy, supple, symmetrical, trachea midline and thyroid normal to inspection and palpation Lungs: clear to auscultation bilaterally Heart: regular rate and rhythm Abdomen: umbilical incision, abdomen is soft, non-tender, no masses,  no organomegaly Extremities: extremities normal, atraumatic, no cyanosis or edema Skin: Skin color, texture, turgor normal. No rashes or lesions Lymph nodes:  Cervical, supraclavicular, and axillary nodes normal. No abnormal inguinal nodes palpated Neurologic: Grossly normal  Pelvic: External genitalia:  no lesions  Urethra:  normal appearing urethra with no masses, tenderness or lesions              Bartholins and Skenes: normal                 Vagina: normal appearing vagina with normal color and discharge, no lesions              Cervix: no lesions                Bimanual Exam:  Uterus:  normal size, contour, position, consistency, mobility, non-tender              Adnexa: no mass, fullness, tenderness              Rectal exam: Yes.  .  Confirms.              Anus:  normal sphincter tone, no lesions  Chaperone was present for exam.  ASSESSMENT   Postmenopausal bleeding.  Benign polypoid endometrium. AGUS pap.  Left ovarian cyst with septations. Normal CA125.  MGM with hx uterine cancer?  PLAN  I discussed total laparoscopic hysterectomy with bilateral salpingo-oophorectomy, used of an intraperitoneal bag for at least ovarian specimen, and collection of pelvic washings. I reviewed  Risks, benefits, and alternatives.  Risks include but are not limited to bleeding, infection, damage to surrounding organs, pneumonia, reaction to anesthesia, DVT, PE, death, need for reoperation, hernia formation, vaginal cuff dehiscence, need to convert to a traditional laparotomy incision to complete the procedure, and surgical staging if a cancer were found on final surgical specimen.  Patient wishes to proceed.  Bowel prep not needed. Surgical expectations and recovery discussed.   An After Visit Summary was printed and given to the patient.       Instructions      Return for surgery!.   After Visit Summary (Printed 06/30/2017)  Media   Electronic signature on 06/30/2017 11:48 AM - Signed

## 2017-07-11 NOTE — Progress Notes (Signed)
GNECOLOGY  VISIT   HPI: 57 y.o.   Divorced  Caucasian  female   G40P2 with Patient's last menstrual period was 11/06/2013 (approximate).   here for 1 week follow up.  Husband present for visit today.  HYSTERECTOMY TOTAL LAPAROSCOPIC collection of pelvic washings. (N/A ) LAPAROSCOPIC SALPINGO OOPHORECTOMY (Bilateral ) CYSTOSCOPY (N/A )     Final pathology - benign endometrial polyp, fibroids, benign cervix, and benign mucinous cystadenoma of left ovary.  Benign pelvic washings.  Not taking pain medication.   Had some loose BMs.   Not really having vaginal bleeding.   GYNECOLOGIC HISTORY: Patient's last menstrual period was 11/06/2013 (approximate). Contraception: Tubal/Hyst Menopausal hormone therapy:  n/a Last mammogram:  06/15/17 BIRADS 1 negative/denisty b Last pap smear:  06/06/17 Colposcopy showing atypia, HPV effect, and low grad dysplasia;3/22/19AGUS andEndometrial cells present; HR HPV not detected         OB History    Gravida  2   Para  2   Term      Preterm      AB      Living  2     SAB      TAB      Ectopic      Multiple      Live Births                 Patient Active Problem List   Diagnosis Date Noted  . Status post total abdominal hysterectomy and bilateral salpingo-oophorectomy 07/12/2017  . 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 Left ovarian cyst 06/02/2017  . Postmenopausal bleeding 05/29/2017    Past Medical History:  Diagnosis Date  . Abnormal uterine bleeding   . Asthma    Related to cats  . 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 Complication of anesthesia   . Left ovarian cyst 2019   septations  . PONV (postoperative nausea and vomiting)     Past Surgical History:  Procedure Laterality Date  . CYSTOSCOPY N/A 07/12/2017   Procedure: CYSTOSCOPY;  Surgeon: Nunzio Cobbs, MD;  Location: [facility];  Service: Gynecology;  Laterality: N/A;  . LAPAROSCOPIC HYSTERECTOMY N/A 07/12/2017   Procedure: HYSTERECTOMY TOTAL LAPAROSCOPIC collection  of pelvic washings.;  Surgeon: Nunzio Cobbs, MD;  Location: [facility];  Service: Gynecology;  Laterality: N/A;  Have alexis bag in room  . LAPAROSCOPIC SALPINGO OOPHERECTOMY Bilateral 07/12/2017   Procedure: LAPAROSCOPIC SALPINGO OOPHORECTOMY;  Surgeon: Nunzio Cobbs, MD;  Location: [facility];  Service: Gynecology;  Laterality: Bilateral;  . TUBAL LIGATION    . WISDOM TOOTH EXTRACTION      No current outpatient medications on file.   No current facility-administered medications for this visit.      ALLERGIES: Patient has no known allergies.  Family History  Problem Relation Age of Onset  . Congestive Heart Failure Mother   . Heart attack Father   . Cancer Brother        Dec 56 bile duct CA  . Cancer Maternal Grandmother        ?endometrial cancer  . Diabetes Brother   . Diabetes Brother     Social History   Socioeconomic History  . Marital status: Divorced    Spouse name: Not on file  . Number of children: Not on file  .  Years of education: Not on file  . Highest education level: Not on file  Occupational History  . Not on file  Social Needs  . Financial resource strain: Not on file  . Food insecurity:    Worry: Not on file    Inability: Not on file  . Transportation needs:    Medical: Not on file    Non-medical: Not on file  Tobacco Use  . Smoking status: Never Smoker  . Smokeless tobacco: Never Used  Substance and Sexual Activity  . Alcohol use: Yes    Alcohol/week: 1.2 oz    Types: 2 Glasses of wine per week    Comment: weekend only  . Drug use: Never  . Sexual activity: Yes    Birth control/protection: Surgical, Post-menopausal    Comment: Tubal  Lifestyle  . Physical activity:    Days per week: Not on file     Minutes per session: Not on file  . Stress: Not on file  Relationships  . Social connections:    Talks on phone: Not on file    Gets together: Not on file    Attends religious service: Not on file    Active member of club or organization: Not on file    Attends meetings of clubs or organizations: Not on file    Relationship status: Not on file  . Intimate partner violence:    Fear of current or ex partner: Not on file    Emotionally abused: Not on file    Physically abused: Not on file    Forced sexual activity: Not on file  Other Topics Concern  . Not on file  Social History Narrative  . Not on file    Review of Systems  Constitutional: Negative.   HENT: Negative.   Eyes: Negative.   Respiratory: Negative.   Cardiovascular: Negative.   Gastrointestinal: Negative.   Endocrine: Negative.   Genitourinary: Negative.   Musculoskeletal: Negative.   Skin: Negative.   Allergic/Immunologic: Negative.   Neurological: Negative.   Hematological: Negative.   Psychiatric/Behavioral: Negative.     PHYSICAL EXAMINATION:    BP 116/70 (BP Location: Right Arm, Patient Position: Sitting, Cuff Size: Normal)   Pulse 76   Resp 14   Ht 5\' 4"  (1.626 m)   Wt 203 lb (92.1 kg)   LMP 11/06/2013 (Approximate)   BMI 34.84 kg/m     General appearance: alert, cooperative and appears stated age   Abdomen: incisions intact.  Ecchymosis and no induration of left lower quadrant incision. Abdomen is soft, non-tender, no masses,  no organomegaly.  ASSESSMENT  Status post laparoscopic hysterectomy with BSO and cystoscopy.  Mild leukocytosis post op.  PLAN  Continue decreased activity.  Nothing per vagina for 8 weeks post op.  Check CBC with diff today.  FU for 6 week post op check.    An After Visit Summary was printed and given to the patient.

## 2017-07-12 ENCOUNTER — Encounter (HOSPITAL_COMMUNITY)
Admission: RE | Disposition: A | Discharge status: Home / Self Care | Payer: Self-pay | Source: Ambulatory Visit | Attending: Obstetrics and Gynecology

## 2017-07-12 ENCOUNTER — Ambulatory Visit (HOSPITAL_BASED_OUTPATIENT_CLINIC_OR_DEPARTMENT_OTHER): Payer: BC Managed Care – PPO | Admitting: Anesthesiology

## 2017-07-12 ENCOUNTER — Encounter (HOSPITAL_BASED_OUTPATIENT_CLINIC_OR_DEPARTMENT_OTHER): Payer: Self-pay | Admitting: *Deleted

## 2017-07-12 ENCOUNTER — Other Ambulatory Visit: Payer: Self-pay

## 2017-07-12 ENCOUNTER — Ambulatory Visit (HOSPITAL_BASED_OUTPATIENT_CLINIC_OR_DEPARTMENT_OTHER)
Admission: RE | Admit: 2017-07-12 | Discharge: 2017-07-12 | Disposition: A | Discharge status: Home / Self Care | Payer: BC Managed Care – PPO | Source: Ambulatory Visit | Attending: Obstetrics and Gynecology | Admitting: Obstetrics and Gynecology

## 2017-07-12 DIAGNOSIS — N870 Mild cervical dysplasia: Secondary | ICD-10-CM | POA: Diagnosis not present

## 2017-07-12 DIAGNOSIS — N950 Postmenopausal bleeding: Secondary | ICD-10-CM | POA: Diagnosis not present

## 2017-07-12 DIAGNOSIS — N838 Other noninflammatory disorders of ovary, fallopian tube and broad ligament: Secondary | ICD-10-CM | POA: Insufficient documentation

## 2017-07-12 DIAGNOSIS — D271 Benign neoplasm of left ovary: Secondary | ICD-10-CM | POA: Insufficient documentation

## 2017-07-12 DIAGNOSIS — N83202 Unspecified ovarian cyst, left side: Secondary | ICD-10-CM | POA: Diagnosis not present

## 2017-07-12 DIAGNOSIS — N8500 Endometrial hyperplasia, unspecified: Secondary | ICD-10-CM | POA: Diagnosis not present

## 2017-07-12 DIAGNOSIS — D259 Leiomyoma of uterus, unspecified: Secondary | ICD-10-CM | POA: Insufficient documentation

## 2017-07-12 DIAGNOSIS — N840 Polyp of corpus uteri: Secondary | ICD-10-CM | POA: Insufficient documentation

## 2017-07-12 DIAGNOSIS — Z9851 Tubal ligation status: Secondary | ICD-10-CM | POA: Diagnosis not present

## 2017-07-12 DIAGNOSIS — R87610 Atypical squamous cells of undetermined significance on cytologic smear of cervix (ASC-US): Secondary | ICD-10-CM | POA: Insufficient documentation

## 2017-07-12 DIAGNOSIS — N736 Female pelvic peritoneal adhesions (postinfective): Secondary | ICD-10-CM | POA: Diagnosis not present

## 2017-07-12 DIAGNOSIS — Q433 Congenital malformations of intestinal fixation: Secondary | ICD-10-CM | POA: Insufficient documentation

## 2017-07-12 DIAGNOSIS — Z90722 Acquired absence of ovaries, bilateral: Secondary | ICD-10-CM

## 2017-07-12 DIAGNOSIS — R87619 Unspecified abnormal cytological findings in specimens from cervix uteri: Secondary | ICD-10-CM | POA: Diagnosis not present

## 2017-07-12 DIAGNOSIS — Z9079 Acquired absence of other genital organ(s): Secondary | ICD-10-CM

## 2017-07-12 DIAGNOSIS — Z90710 Acquired absence of both cervix and uterus: Secondary | ICD-10-CM

## 2017-07-12 HISTORY — PX: LAPAROSCOPIC HYSTERECTOMY: SHX1926

## 2017-07-12 HISTORY — PX: LAPAROSCOPIC SALPINGO OOPHERECTOMY: SHX5927

## 2017-07-12 HISTORY — PX: CYSTOSCOPY: SHX5120

## 2017-07-12 LAB — CBC
HCT: 36.5 % (ref 36.0–46.0)
HEMATOCRIT: 36 % (ref 36.0–46.0)
Hemoglobin: 11.9 g/dL — ABNORMAL LOW (ref 12.0–15.0)
Hemoglobin: 11.8 g/dL — ABNORMAL LOW (ref 12.0–15.0)
MCH: 29.5 pg (ref 26.0–34.0)
MCH: 29.6 pg (ref 26.0–34.0)
MCHC: 32.6 g/dL (ref 30.0–36.0)
MCHC: 32.8 g/dL (ref 30.0–36.0)
MCV: 90.3 fL (ref 78.0–100.0)
MCV: 90.5 fL (ref 78.0–100.0)
PLATELETS: 209 10*3/uL (ref 150–400)
Platelets: 226 10*3/uL (ref 150–400)
RBC: 4.04 MIL/uL (ref 3.87–5.11)
RBC: 3.98 MIL/uL (ref 3.87–5.11)
RDW: 13.4 % (ref 11.5–15.5)
RDW: 13.3 % (ref 11.5–15.5)
WBC: 11.5 10*3/uL — ABNORMAL HIGH (ref 4.0–10.5)
WBC: 13.2 10*3/uL — AB (ref 4.0–10.5)

## 2017-07-12 LAB — TYPE AND SCREEN
ABO/RH(D): A NEG
ANTIBODY SCREEN: NEGATIVE

## 2017-07-12 SURGERY — HYSTERECTOMY, TOTAL, LAPAROSCOPIC
Anesthesia: General

## 2017-07-12 MED ORDER — DEXAMETHASONE SODIUM PHOSPHATE 10 MG/ML IJ SOLN
INTRAMUSCULAR | Status: AC
Start: 2017-07-12 — End: 2017-07-12
  Filled 2017-07-12: qty 1

## 2017-07-12 MED ORDER — ACETAMINOPHEN 10 MG/ML IV SOLN: INTRAVENOUS | Status: DC | PRN

## 2017-07-12 MED ORDER — ROPIVACAINE HCL 5 MG/ML IJ SOLN
INTRAMUSCULAR | Status: DC | PRN
  Administered 2017-07-12: 60 mL

## 2017-07-12 MED ORDER — ONDANSETRON HCL 4 MG PO TABS: 4.0000 mg | ORAL_TABLET | Freq: Four times a day (QID) | ORAL | Status: DC | PRN

## 2017-07-12 MED ORDER — ROCURONIUM BROMIDE 10 MG/ML (PF) SYRINGE
PREFILLED_SYRINGE | INTRAVENOUS | Status: AC
  Filled 2017-07-12: qty 5

## 2017-07-12 MED ORDER — PROPOFOL 10 MG/ML IV BOLUS
INTRAVENOUS | Status: AC
  Filled 2017-07-12: qty 40

## 2017-07-12 MED ORDER — SODIUM CHLORIDE 0.9 % IJ SOLN
INTRAMUSCULAR | Status: AC
  Filled 2017-07-12: qty 50

## 2017-07-12 MED ORDER — LIDOCAINE 2% (20 MG/ML) 5 ML SYRINGE
INTRAMUSCULAR | Status: DC | PRN
  Administered 2017-07-12: 60 mg via INTRAVENOUS

## 2017-07-12 MED ORDER — FENTANYL CITRATE (PF) 250 MCG/5ML IJ SOLN
INTRAMUSCULAR | Status: AC
  Filled 2017-07-12: qty 5

## 2017-07-12 MED ORDER — OXYCODONE-ACETAMINOPHEN 5-325 MG PO TABS: 1.0000 | ORAL_TABLET | ORAL | 0 refills | Status: DC | PRN

## 2017-07-12 MED ORDER — SUGAMMADEX SODIUM 200 MG/2ML IV SOLN
INTRAVENOUS | Status: DC | PRN
  Administered 2017-07-12: 200 mg via INTRAVENOUS

## 2017-07-12 MED ORDER — HYDROMORPHONE HCL 1 MG/ML IJ SOLN
INTRAMUSCULAR | Status: AC
  Filled 2017-07-12: qty 1

## 2017-07-12 MED ORDER — PROMETHAZINE HCL 25 MG/ML IJ SOLN
6.2500 mg | Freq: Once | INTRAMUSCULAR | Status: AC
  Administered 2017-07-12: 6.25 mg via INTRAVENOUS
  Filled 2017-07-12: qty 1

## 2017-07-12 MED ORDER — SUCCINYLCHOLINE CHLORIDE 200 MG/10ML IV SOSY
PREFILLED_SYRINGE | INTRAVENOUS | Status: AC
  Filled 2017-07-12: qty 10

## 2017-07-12 MED ORDER — ENOXAPARIN SODIUM 40 MG/0.4ML ~~LOC~~ SOLN
SUBCUTANEOUS | Status: AC
  Filled 2017-07-12: qty 0.4

## 2017-07-12 MED ORDER — ONDANSETRON HCL 4 MG/2ML IJ SOLN
INTRAMUSCULAR | Status: AC
  Filled 2017-07-12: qty 2

## 2017-07-12 MED ORDER — LIDOCAINE 2% (20 MG/ML) 5 ML SYRINGE
INTRAMUSCULAR | Status: AC
  Filled 2017-07-12: qty 5

## 2017-07-12 MED ORDER — PROMETHAZINE HCL 25 MG/ML IJ SOLN
INTRAMUSCULAR | Status: AC
  Filled 2017-07-12: qty 1

## 2017-07-12 MED ORDER — KETOROLAC TROMETHAMINE 30 MG/ML IJ SOLN
INTRAMUSCULAR | Status: DC | PRN
  Administered 2017-07-12: 30 mg via INTRAVENOUS

## 2017-07-12 MED ORDER — ARTIFICIAL TEARS OPHTHALMIC OINT
TOPICAL_OINTMENT | OPHTHALMIC | Status: AC
  Filled 2017-07-12: qty 3.5

## 2017-07-12 MED ORDER — ONDANSETRON HCL 4 MG/2ML IJ SOLN
INTRAMUSCULAR | Status: DC | PRN
  Administered 2017-07-12: 4 mg via INTRAVENOUS

## 2017-07-12 MED ORDER — ONDANSETRON HCL 4 MG/2ML IJ SOLN: 4.0000 mg | Freq: Four times a day (QID) | INTRAMUSCULAR | Status: DC | PRN

## 2017-07-12 MED ORDER — LACTATED RINGERS IV SOLN
INTRAVENOUS | Status: DC
  Filled 2017-07-12: qty 1000

## 2017-07-12 MED ORDER — IBUPROFEN 800 MG PO TABS: 800.0000 mg | ORAL_TABLET | Freq: Three times a day (TID) | ORAL | 0 refills | Status: DC | PRN

## 2017-07-12 MED ORDER — OXYCODONE-ACETAMINOPHEN 5-325 MG PO TABS
1.0000 | ORAL_TABLET | ORAL | Status: DC | PRN
Start: 2017-07-12 — End: 2017-07-12
  Administered 2017-07-12: 1 via ORAL
  Filled 2017-07-12: qty 1

## 2017-07-12 MED ORDER — KETOROLAC TROMETHAMINE 30 MG/ML IJ SOLN
INTRAMUSCULAR | Status: AC
  Filled 2017-07-12: qty 1

## 2017-07-12 MED ORDER — ACETAMINOPHEN 10 MG/ML IV SOLN
INTRAVENOUS | Status: AC
  Filled 2017-07-12: qty 100

## 2017-07-12 MED ORDER — SODIUM CHLORIDE 0.9% FLUSH
INTRAVENOUS | Status: DC | PRN
  Administered 2017-07-12: 10 mL

## 2017-07-12 MED ORDER — PROPOFOL 10 MG/ML IV BOLUS
INTRAVENOUS | Status: DC | PRN
  Administered 2017-07-12: 170 mg via INTRAVENOUS

## 2017-07-12 MED ORDER — BUPIVACAINE HCL (PF) 0.25 % IJ SOLN
INTRAMUSCULAR | Status: AC
  Filled 2017-07-12: qty 30

## 2017-07-12 MED ORDER — DEXAMETHASONE SODIUM PHOSPHATE 10 MG/ML IJ SOLN
INTRAMUSCULAR | Status: DC | PRN
  Administered 2017-07-12: 10 mg via INTRAVENOUS

## 2017-07-12 MED ORDER — MIDAZOLAM HCL 2 MG/2ML IJ SOLN
INTRAMUSCULAR | Status: DC | PRN
  Administered 2017-07-12: 2 mg via INTRAVENOUS

## 2017-07-12 MED ORDER — FENTANYL CITRATE (PF) 100 MCG/2ML IJ SOLN
25.0000 ug | INTRAMUSCULAR | Status: DC | PRN
  Filled 2017-07-12: qty 1

## 2017-07-12 MED ORDER — MENTHOL 3 MG MT LOZG: 1.0000 | LOZENGE | OROMUCOSAL | Status: DC | PRN

## 2017-07-12 MED ORDER — ENOXAPARIN SODIUM 40 MG/0.4ML ~~LOC~~ SOLN
40.0000 mg | SUBCUTANEOUS | Status: AC
  Administered 2017-07-12: 40 mg via SUBCUTANEOUS
  Filled 2017-07-12: qty 0.4

## 2017-07-12 MED ORDER — ROPIVACAINE HCL 5 MG/ML IJ SOLN
INTRAMUSCULAR | Status: AC
  Filled 2017-07-12: qty 30

## 2017-07-12 MED ORDER — DEXAMETHASONE SODIUM PHOSPHATE 10 MG/ML IJ SOLN
INTRAMUSCULAR | Status: AC
  Filled 2017-07-12: qty 1

## 2017-07-12 MED ORDER — ROCURONIUM BROMIDE 10 MG/ML (PF) SYRINGE
PREFILLED_SYRINGE | INTRAVENOUS | Status: DC | PRN
  Administered 2017-07-12: 50 mg via INTRAVENOUS
  Administered 2017-07-12: 20 mg via INTRAVENOUS
  Administered 2017-07-12: 10 mg via INTRAVENOUS

## 2017-07-12 MED ORDER — CEFOTETAN DISODIUM-DEXTROSE 2-2.08 GM-%(50ML) IV SOLR
INTRAVENOUS | Status: AC
  Filled 2017-07-12: qty 50

## 2017-07-12 MED ORDER — ACETAMINOPHEN 10 MG/ML IV SOLN
INTRAVENOUS | Status: DC | PRN
  Administered 2017-07-12: 1000 mg via INTRAVENOUS

## 2017-07-12 MED ORDER — LACTATED RINGERS IV SOLN
INTRAVENOUS | Status: DC
  Administered 2017-07-12 (×2): via INTRAVENOUS
  Filled 2017-07-12: qty 1000

## 2017-07-12 MED ORDER — BUPIVACAINE HCL (PF) 0.25 % IJ SOLN
INTRAMUSCULAR | Status: DC | PRN
  Administered 2017-07-12: 6 mL

## 2017-07-12 MED ORDER — FENTANYL CITRATE (PF) 100 MCG/2ML IJ SOLN
INTRAMUSCULAR | Status: DC | PRN
  Administered 2017-07-12: 100 ug via INTRAVENOUS

## 2017-07-12 MED ORDER — LACTATED RINGERS IR SOLN
Status: DC | PRN
  Administered 2017-07-12: 3000 mL

## 2017-07-12 MED ORDER — IBUPROFEN 800 MG PO TABS: 800.0000 mg | ORAL_TABLET | Freq: Three times a day (TID) | ORAL | Status: DC | PRN

## 2017-07-12 MED ORDER — KETOROLAC TROMETHAMINE 30 MG/ML IJ SOLN
30.0000 mg | Freq: Four times a day (QID) | INTRAMUSCULAR | Status: DC
  Administered 2017-07-12: 30 mg via INTRAVENOUS
  Filled 2017-07-12: qty 1

## 2017-07-12 MED ORDER — MORPHINE SULFATE (PF) 2 MG/ML IV SOLN: 2.0000 mg | INTRAVENOUS | Status: DC | PRN

## 2017-07-12 MED ORDER — STERILE WATER FOR IRRIGATION IR SOLN
Status: DC | PRN
  Administered 2017-07-12: 3000 mL via INTRAVESICAL

## 2017-07-12 MED ORDER — MIDAZOLAM HCL 2 MG/2ML IJ SOLN
INTRAMUSCULAR | Status: AC
  Filled 2017-07-12: qty 2

## 2017-07-12 MED ORDER — SODIUM CHLORIDE 0.9 % IV SOLN
2.0000 g | INTRAVENOUS | Status: AC
  Administered 2017-07-12: 2 g via INTRAVENOUS
  Filled 2017-07-12: qty 2

## 2017-07-12 MED ORDER — HYDROMORPHONE HCL 1 MG/ML IJ SOLN
0.2500 mg | INTRAMUSCULAR | Status: DC | PRN
  Administered 2017-07-12: 0.5 mg via INTRAVENOUS
  Filled 2017-07-12: qty 0.5

## 2017-07-12 SURGICAL SUPPLY — 73 items
APPLICATOR ARISTA FLEXITIP XL (MISCELLANEOUS) ×4 IMPLANT
BAG RETRIEVAL 10MM (BASKET)
BARRIER ADHS 3X4 INTERCEED (GAUZE/BANDAGES/DRESSINGS) IMPLANT
CABLE HIGH FREQUENCY MONO STRZ (ELECTRODE) ×4 IMPLANT
CANISTER SUCT 3000ML PPV (MISCELLANEOUS) ×4 IMPLANT
CELL SAVER LIPIGURD (MISCELLANEOUS) IMPLANT
CONT SPEC 4OZ CLIKSEAL STRL BL (MISCELLANEOUS) ×4 IMPLANT
COVER LIGHT HANDLE  1/PK (MISCELLANEOUS) ×2
COVER LIGHT HANDLE 1/PK (MISCELLANEOUS) ×2 IMPLANT
COVER MAYO STAND STRL (DRAPES) ×4 IMPLANT
COVER TABLE BACK 60X90 (DRAPES) ×4 IMPLANT
DECANTER SPIKE VIAL GLASS SM (MISCELLANEOUS) ×8 IMPLANT
DERMABOND ADVANCED (GAUZE/BANDAGES/DRESSINGS) ×2
DERMABOND ADVANCED .7 DNX12 (GAUZE/BANDAGES/DRESSINGS) ×2 IMPLANT
DRSG COVADERM PLUS 2X2 (GAUZE/BANDAGES/DRESSINGS) IMPLANT
DRSG OPSITE POSTOP 3X4 (GAUZE/BANDAGES/DRESSINGS) IMPLANT
DURAPREP 26ML APPLICATOR (WOUND CARE) ×4 IMPLANT
EXTRT SYSTEM ALEXIS 14CM (MISCELLANEOUS)
EXTRT SYSTEM ALEXIS 17CM (MISCELLANEOUS)
GLOVE BIO SURGEON STRL SZ 6.5 (GLOVE) ×6 IMPLANT
GLOVE BIO SURGEONS STRL SZ 6.5 (GLOVE) ×2
GLOVE BIOGEL PI IND STRL 7.0 (GLOVE) ×6 IMPLANT
GLOVE BIOGEL PI INDICATOR 7.0 (GLOVE) ×6
GOWN STRL REUS W/TWL LRG LVL3 (GOWN DISPOSABLE) ×16 IMPLANT
HEMOSTAT ARISTA ABSORB 3G PWDR (MISCELLANEOUS) ×4 IMPLANT
HOLDER FOLEY CATH W/STRAP (MISCELLANEOUS) ×4 IMPLANT
LIGASURE VESSEL 5MM BLUNT TIP (ELECTROSURGICAL) ×4 IMPLANT
NEEDLE INSUFFLATION 120MM (ENDOMECHANICALS) ×4 IMPLANT
NS IRRIG 1000ML POUR BTL (IV SOLUTION) ×4 IMPLANT
OCCLUDER COLPOPNEUMO (BALLOONS) ×4 IMPLANT
PACK LAPAROSCOPY BASIN (CUSTOM PROCEDURE TRAY) ×4 IMPLANT
PACK TRENDGUARD 450 HYBRID PRO (MISCELLANEOUS) ×2 IMPLANT
POUCH LAPAROSCOPIC INSTRUMENT (MISCELLANEOUS) ×4 IMPLANT
POUCH SPECIMEN RETRIEVAL 10MM (ENDOMECHANICALS) ×4 IMPLANT
PROTECTOR NERVE ULNAR (MISCELLANEOUS) ×8 IMPLANT
RETRACTOR WOUND ALXS 19CM XSML (INSTRUMENTS) IMPLANT
RTRCTR WOUND ALEXIS 19CM XSML (INSTRUMENTS)
SCISSORS LAP 5X35 DISP (ENDOMECHANICALS) ×4 IMPLANT
SET CYSTO W/LG BORE CLAMP LF (SET/KITS/TRAYS/PACK) ×4 IMPLANT
SET IRRIG TUBING LAPAROSCOPIC (IRRIGATION / IRRIGATOR) ×4 IMPLANT
SET IRRIG Y TYPE TUR BLADDER L (SET/KITS/TRAYS/PACK) ×4 IMPLANT
SET TRI-LUMEN FLTR TB AIRSEAL (TUBING) ×4 IMPLANT
SHEARS HARMONIC ACE PLUS 36CM (ENDOMECHANICALS) ×8 IMPLANT
SLEEVE ADV FIXATION 5X100MM (TROCAR) ×4 IMPLANT
SUT PLAIN 3 0 FS 2 27 (SUTURE) IMPLANT
SUT VIC AB 0 CT1 27 (SUTURE) ×4
SUT VIC AB 0 CT1 27XBRD ANBCTR (SUTURE) ×4 IMPLANT
SUT VIC AB 4-0 SH 27 (SUTURE) ×2
SUT VIC AB 4-0 SH 27XANBCTRL (SUTURE) ×2 IMPLANT
SUT VICRYL 0 UR6 27IN ABS (SUTURE) ×4 IMPLANT
SUT VICRYL 4-0 PS2 18IN ABS (SUTURE) ×4 IMPLANT
SUT VLOC 180 0 9IN  GS21 (SUTURE)
SUT VLOC 180 0 9IN GS21 (SUTURE) IMPLANT
SYR 50ML LL SCALE MARK (SYRINGE) ×8 IMPLANT
SYRINGE 10CC LL (SYRINGE) ×4 IMPLANT
SYS BAG RETRIEVAL 10MM (BASKET)
SYSTEM BAG RETRIEVAL 10MM (BASKET) IMPLANT
SYSTEM CARTER THOMASON II (TROCAR) ×4 IMPLANT
SYSTEM CONTND EXTRCTN KII BLLN (MISCELLANEOUS) IMPLANT
TIP RUMI ORANGE 6.7MMX12CM (TIP) IMPLANT
TIP UTERINE 5.1X6CM LAV DISP (MISCELLANEOUS) IMPLANT
TIP UTERINE 6.7X10CM GRN DISP (MISCELLANEOUS) IMPLANT
TIP UTERINE 6.7X6CM WHT DISP (MISCELLANEOUS) IMPLANT
TIP UTERINE 6.7X8CM BLUE DISP (MISCELLANEOUS) ×4 IMPLANT
TOWEL OR 17X24 6PK STRL BLUE (TOWEL DISPOSABLE) ×8 IMPLANT
TRAY FOLEY CATH SILVER 14FR (SET/KITS/TRAYS/PACK) ×4 IMPLANT
TRENDGUARD 450 HYBRID PRO PACK (MISCELLANEOUS) ×4
TROCAR ADV FIXATION 5X100MM (TROCAR) ×4 IMPLANT
TROCAR PORT AIRSEAL 8X120 (TROCAR) ×4 IMPLANT
TROCAR XCEL NON-BLD 11X100MML (ENDOMECHANICALS) IMPLANT
TROCAR XCEL NON-BLD 5MMX100MML (ENDOMECHANICALS) ×4 IMPLANT
TUBING INSUF HEATED (TUBING) ×4 IMPLANT
WARMER LAPAROSCOPE (MISCELLANEOUS) ×4 IMPLANT

## 2017-07-12 NOTE — Anesthesia Procedure Notes (Addendum)
Procedure Name: Intubation Date/Time: 07/12/2017 7:35 AM Performed by: Wanita Chamberlain, CRNA Pre-anesthesia Checklist: Patient identified, Timeout performed, Emergency Drugs available, Suction available and Patient being monitored Patient Re-evaluated:Patient Re-evaluated prior to induction Oxygen Delivery Method: Circle system utilized Preoxygenation: Pre-oxygenation with 100% oxygen Induction Type: IV induction Ventilation: Mask ventilation without difficulty Laryngoscope Size: Mac and 3 (Cricoid manipulation Grade I view) Grade View: Grade I Tube type: Oral Tube size: 7.5 mm Number of attempts: 1 Airway Equipment and Method: Stylet and Oral airway Placement Confirmation: ETT inserted through vocal cords under direct vision,  positive ETCO2,  CO2 detector and breath sounds checked- equal and bilateral Secured at: 22 cm Tube secured with: Tape Dental Injury: Teeth and Oropharynx as per pre-operative assessment

## 2017-07-12 NOTE — Anesthesia Postprocedure Evaluation (Signed)
Anesthesia Post Note  Patient: Alyssa Chavez  Procedure(s) Performed: HYSTERECTOMY TOTAL LAPAROSCOPIC collection  of pelvic washings. (N/A ) LAPAROSCOPIC SALPINGO OOPHORECTOMY (Bilateral ) CYSTOSCOPY (N/A )     Patient location during evaluation: PACU Anesthesia Type: General Level of consciousness: awake Pain management: pain level controlled Vital Signs Assessment: post-procedure vital signs reviewed and stable Respiratory status: spontaneous breathing Cardiovascular status: stable Anesthetic complications: no    Last Vitals:  Vitals:   07/12/17 0531 07/12/17 1033  BP: 134/73 (!) (P) 119/57  Pulse: 79   Resp: 16   Temp: 36.4 C   SpO2: 98%     Last Pain:  Vitals:   07/12/17 0531  TempSrc: Oral                 Wilho Sharpley Doleman

## 2017-07-12 NOTE — Progress Notes (Signed)
Spoke with Dr. Elza Rafter office and they are paging to find out if telemetry can be D/C'd. Awaiting orders.

## 2017-07-12 NOTE — Progress Notes (Signed)
Update to History and Physical  No marked change in status since office preop visit.  K level and hemoglobin A1C normal.  Patient examined.  OK to proceed with surgery.

## 2017-07-12 NOTE — Op Note (Signed)
OPERATIVE REPORT   PREOPERATIVE DIAGNOSIS:  Postmenopausal bleeding, polypoid endometrium, AGUS pap, low grade cervical dysplasia, complex left ovarian cyst.  POSTOPERATIVE DIAGNOSIS:  Postmenopausal bleeding, polypoid endometrium, AGUS pap, low grade cervical dysplasia, complex left ovarian cyst.  PROCEDURES: Total laparoscopic hysterectomy with bilateral salpingo-oophorectomy, collection of pelvic washings, cystoscopy  SURGEON: Lenard Galloway, M.D.  ASSISTANT: Dorothy Spark, M.D.  ANESTHESIA: General endotracheal, intraperitoneal ropivicaine 30 mL diluted in 30 mL of normal saline, local with 0.25% Marcaine.  IVF:   1500 cc LR.  ESTIMATED BLOOD LOSS:   25 cc.  URINE OUTPUT:  150 cc.  COMPLICATIONS: None.  INDICATIONS FOR THE PROCEDURE:    The patient is a 57 year old Gravida 2, 92 2 caucasian female who presents with postmenopausal bleeding.  Pelvic ultrasound showed an endometrial stripe of 9 mm and the left ovary demonstrated a complex left ovarian cyst measuring 63 x 39 x 40 mm.  Her CA125 was 12.4.  Endometrial biopsy documented polypoid endometrium.  Her pap was AGUS and colposcopic biopsy of the cervix was low grade cervical dysplasia and the ECC was negative.  A plan is made to proceed with a total laparoscopic hysterectomy with bilateral salpingo-oophorectomy, collection of pelvic washings, and cystoscopy after risks, benefits, and alternatives are reviewed.  FINDINGS:    Laparoscopy revealed a normal uterus and right ovary.  The left ovary measured 6 x 4 cm and had a smooth surface.  The bilateral fallopian tubes were consistent with tubal ligation using fallope rings.  The upper abdomen was normal and demonstrated a normal liver.  There was no adhesive disease or endometriosis were seen in the abdomen or pelvis.  Cystoscopy at the termination of the procedure showed the bladder to be normal throughout 360 degrees including the bladder dome and trigone. There  was no evidence of any foreign body in the bladder or the urethra. There was no evidence of any lesions of the bladder or the urethra.  Both of the ureters were noted to be patent bilaterally.  SPECIMENS:    The uterus, cervix, and bilateral tubes and ovaries were went to pathology separately from the pelvic washings.   DESCRIPTION OF PROCEDURE:   The patient was reidentified in the preoperative hold area.  She did receive Cefotetan IV for antibiotic prophylaxis. She received Lovenox, TED hose and PAS stockings for DVT prophylaxis.  In the operating room, the patient was placed in the dorsal lithotomy position on the operating room table. The Trendguard was used for positioning and support.  Her legs were placed in the Marion stirrups and her arms were both tucked at her sides. The patient received general endotracheal anesthesia. The abdomen and vagina were then sterilely prepped and she was sterilely draped.  A speculum was placed in the vagina and a single-tooth tenaculum was placed on the anterior cervical lip. A figure-of-eight suture of 0 Vicryl was placed on each the anterior and the posterior cervical lips. The uterus was sounded to __almost 8 ___ cm. The cervix was then dilated with [facility] dilators. A #  __8____ RUMI tip with a KOH ring was then placed through the cervix and into the uterine cavity without difficulty. The remaining vaginal instruments were then removed. A Foley catheter was placed inside the bladder.  Attention was turned to the abdomen where the umbilical region was injected with 0.25% Marcaine and a small incision created.  A Veress needle was then used to insufflate the abdomen with CO2 gas after a saline drop  test was performed and the fluid flowed freely.  A 5 mm umbilical incision was created with a scalpel after the skin. A 5 mm camera port was then placed using the Optiview. 5 mm incisions were then created in the left upper abdomen and the left lower  abdomen after the skin was injected locally with 0.25% Marcaine.  The 5 mm trocars were then placed under visualization of the laparoscope.  A 8 mm Air Seal trocar was placed in the right lower quadrant after injecting with Marcaine and incising with a scalpel.   Pelvic washings were obtained and sent to pathology.   Ropivicaine 30 cc in 30 cc of normal saline was placed inside the peritoneal cavity.   At this time, the patient was placed in Trendelenburg position. An inspection of the abdomen and pelvis was performed. The findings are as noted above.   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  The infundibulopelvic ligament was grasped and the Ligasure was used to cauterize and cut through the pedicle.  The  The left broad ligament and then the left round ligament were cauterized and divided with same instrument. The left utero-ovarian pedicle was cauterized with the Ligasure, and the right adnexa was placed in the right side of the pelvis. Dissection was performed to the anterior and posterior leaves of the broad ligaments using the Harmonic scalpel.  The incision was carried across the anterior cul- de-sac along the vesicouterine fold and the bladder was dissected away from the cervix using the Harmonic scalpel. The peritoneum was taken down posteriorly. The left uterine artery was skeletonized at this time using sharp dissection and monopolar cautery.   It was then cauterized and later cut with the Ligasure instrument.  Attention was turned to the patient's right-hand side at this time. The same procedure that was performed on the left side was repeated on the right side with respect to the isolation, cautery, and transection of the vessels and the bladder flap dissection.    The KOH ring was nicely visible. The colpotomy incision was performed with the harmonic scalpel in a circumferential fashion. The uterus and right tube  and ovary were then removed from the peritoneal cavity.  An Endocatch was placed vaginally inside the peritoneal cavity and the tube and ovary were placed inside the Endocatch bag and removed from the peritoneal cavity.  The balloon occluder was placed in the vagina. The vaginal cuff was sutured at this time using a running suture of 0 V-Loc. The vagina was closed from the patient's right hand side to the left hand side and then back 2 sutures towards the midline. This provided good full-thickness closure of the vaginal cuff.  The laparoscopic needle for suturing was removed from the peritoneal cavity.  The pelvis was irrigated and suctioned. There was good hemostasis of the operative sites and pedicles. Arista was placed over the surgical field.  The 8 Air Seal trocar was removed and the [employer] instrument was used to close the fascia with a 0/0 vicryl suture.   The CO2 pneumoperitoneum was released, and the remaining trocars were removed after the patient received manual breaths to remove any remaining CO2 gas.  The patient's Foley catheter was removed, and cystoscopy was performed.  The findings are as noted above. The Foley catheter was left out.   Final inspection of the vagina demonstrated good hemostasis of the vaginal cuff.  All skin incisions were closed with  subcuticular sutures of 4-0 Vicryl. Dermabond was placed over the incisions.  This concluded the patient's procedure. She was extubated and escorted to the recovery room in stable and awake condition. There were no complications to the procedure. All needle, instrument, and sponge counts were correct.

## 2017-07-12 NOTE — Anesthesia Preprocedure Evaluation (Addendum)
Anesthesia Evaluation  Patient identified by MRN, date of birth, ID band Patient awake    Reviewed: Allergy & Precautions, NPO status , Patient's Chart, lab work & pertinent test results  History of Anesthesia Complications (+) PONV and PSEUDOCHOLINESTERASE DEFICIENCY  Airway Mallampati: II  TM Distance: >3 FB Neck ROM: Full    Dental  (+) Teeth Intact, Dental Advisory Given,    Pulmonary asthma ,    breath sounds clear to auscultation       Cardiovascular negative cardio ROS   Rhythm:Regular Rate:Normal     Neuro/Psych    GI/Hepatic negative GI ROS, Neg liver ROS,   Endo/Other  negative endocrine ROS  Renal/GU negative Renal ROS     Musculoskeletal   Abdominal   Peds  Hematology   Anesthesia Other Findings   Reproductive/Obstetrics                           Anesthesia Physical Anesthesia Plan  ASA: II  Anesthesia Plan: General   Post-op Pain Management:    Induction: Intravenous  PONV Risk Score and Plan: 4 or greater and Ondansetron, Dexamethasone and Midazolam  Airway Management Planned: Oral ETT  Additional Equipment:   Intra-op Plan:   Post-operative Plan: Extubation in OR  Informed Consent: I have reviewed the patients History and Physical, chart, labs and discussed the procedure including the risks, benefits and alternatives for the proposed anesthesia with the patient or authorized representative who has indicated his/her understanding and acceptance.   Dental advisory given  Plan Discussed with: CRNA and Anesthesiologist  Anesthesia Plan Comments:         Anesthesia Quick Evaluation

## 2017-07-12 NOTE — Progress Notes (Signed)
Day of Surgery Procedure(s) (LRB): HYSTERECTOMY TOTAL LAPAROSCOPIC collection  of pelvic washings. (N/A) LAPAROSCOPIC SALPINGO OOPHORECTOMY (Bilateral) CYSTOSCOPY (N/A)  Subjective: Patient reports tolerating PO and no problems voiding.   No nausea.  Ambulated to the bathroom.  Voided but flushed so it was not measured. Wants discharge to home.  Objective: I have reviewed patient's vital signs, intake and output and labs. Vitals:   07/12/17 1532 07/12/17 1636  BP: 123/71 133/77  Pulse: 70 79  Resp: 16 18  Temp: 98.3 F (36.8 C) 98.5 F (36.9 C)  SpO2: 98% 100%   I/O - 2417 cc/1942 cc.   WBC - 13.2 HGB - 11.8  General: alert and cooperative Resp: clear to auscultation bilaterally Cardio: regular rate and rhythm, S1, S2 normal, no murmur, click, rub or gallop GI: soft, non-tender; bowel sounds normal; no masses,  no organomegaly and incision: clean, dry and intact Extremities: PAS and Ted hose on.  DPs 2+ bilaterally.  Vaginal Bleeding: none  Assessment: s/p Procedure(s) with comments: HYSTERECTOMY TOTAL LAPAROSCOPIC collection  of pelvic washings. (N/A) - Have alexis bag in room LAPAROSCOPIC SALPINGO OOPHORECTOMY (Bilateral) CYSTOSCOPY (N/A): progressing well  Plan: Advance diet Encourage ambulation Advance to PO medication Discharge home  Surgical findings and procedure reviewed with patient.  Questions answered.  Discharge instructions and precautions reviewed in verbal and written form.  Rx for Percocet and Motrin.  Follow up in 1 week.    LOS: 0 days    Arloa Koh 07/12/2017, 6:38 PM

## 2017-07-12 NOTE — Brief Op Note (Signed)
07/12/2017  10:36 AM  PATIENT:  Alyssa Chavez  57 y.o. female  PRE-OPERATIVE DIAGNOSIS:  PMB, polypoid endometrium, AGUS pap, low grade cervical dysplasia, complex left ovarian cyst  POST-OPERATIVE DIAGNOSIS:  PMB, polypoid endometrium, AGUS pap, low grade cervical dysplasia, complex left ovarian cyst  PROCEDURE:  Procedure(s) with comments: HYSTERECTOMY TOTAL LAPAROSCOPIC collection  of pelvic washings. (N/A) - Have alexis bag in room LAPAROSCOPIC SALPINGO OOPHORECTOMY (Bilateral) CYSTOSCOPY (N/A)  SURGEON:  Surgeon(s) and Role:    * Amundson Raliegh Ip, MD - Primary    * Salvadore Dom, MD - Assisting  PHYSICIAN ASSISTANT: NA  ASSISTANTS: Dorothy Spark, MD   ANESTHESIA:   local, general and Intraperitoneal Ropivicaine, IV Tylenol  EBL:  25 mL   BLOOD ADMINISTERED:none  DRAINS: none   LOCAL MEDICATIONS USED:  MARCAINE     SPECIMEN:  Source of Specimen:  Uterus, cervix, bilateral tubes and ovaries.  Peritoneal washings.   DISPOSITION OF SPECIMEN:  PATHOLOGY  COUNTS:  YES  TOURNIQUET:  * No tourniquets in log *  DICTATION: .Note written in EPIC  PLAN OF CARE: Outpatient with extended recovery.  PATIENT DISPOSITION:  PACU - hemodynamically stable.   Delay start of Pharmacological VTE agent (>24hrs) due to surgical blood loss or risk of bleeding: not applicable

## 2017-07-12 NOTE — Transfer of Care (Signed)
Immediate Anesthesia Transfer of Care Note  Patient: Alyssa Chavez  Procedure(s) Performed: HYSTERECTOMY TOTAL LAPAROSCOPIC collection  of pelvic washings. (N/A ) LAPAROSCOPIC SALPINGO OOPHORECTOMY (Bilateral ) CYSTOSCOPY (N/A )  Patient Location: PACU  Anesthesia Type:General  Level of Consciousness: awake, alert , oriented and patient cooperative  Airway & Oxygen Therapy: Patient Spontanous Breathing and Patient connected to nasal cannula oxygen  Post-op Assessment: Report given to RN and Post -op Vital signs reviewed and stable  Post vital signs: Reviewed and stable  Last Vitals:  Vitals Value Taken Time  BP    Temp    Pulse 71 07/12/2017 10:33 AM  Resp    SpO2 100 % 07/12/2017 10:33 AM  Vitals shown include unvalidated device data.  Last Pain:  Vitals:   07/12/17 0531  TempSrc: Oral      Patients Stated Pain Goal: 4 (13/24/40 1027)  Complications: No apparent anesthesia complications

## 2017-07-12 NOTE — Progress Notes (Addendum)
Patient was given discharge instructions and questions were answered. RN remove removed patient's IV. Patient was taken out in a wheelchair by tech, her boyfriend took her home.

## 2017-07-12 NOTE — Discharge Instructions (Signed)

## 2017-07-13 ENCOUNTER — Encounter (HOSPITAL_BASED_OUTPATIENT_CLINIC_OR_DEPARTMENT_OTHER): Payer: Self-pay | Admitting: Obstetrics and Gynecology

## 2017-07-19 ENCOUNTER — Other Ambulatory Visit: Payer: Self-pay

## 2017-07-19 ENCOUNTER — Telehealth: Payer: Self-pay | Admitting: Obstetrics and Gynecology

## 2017-07-19 ENCOUNTER — Encounter: Payer: Self-pay | Admitting: Obstetrics and Gynecology

## 2017-07-19 ENCOUNTER — Ambulatory Visit (INDEPENDENT_AMBULATORY_CARE_PROVIDER_SITE_OTHER): Payer: BC Managed Care – PPO | Admitting: Obstetrics and Gynecology

## 2017-07-19 VITALS — BP 116/70 | HR 76 | Resp 14 | Ht 64.0 in | Wt 203.0 lb

## 2017-07-19 DIAGNOSIS — Z9889 Other specified postprocedural states: Secondary | ICD-10-CM

## 2017-07-19 DIAGNOSIS — D72829 Elevated white blood cell count, unspecified: Secondary | ICD-10-CM

## 2017-07-19 NOTE — Telephone Encounter (Signed)
Patient says after leaving post op visit today, she became cold and had a fever. Left message on answering machine during lunch.

## 2017-07-19 NOTE — Telephone Encounter (Signed)
Reviewed with Dr. Quincy Simmonds. Call returned to patient. Recommended OTC tylenol prn for fever, hydrate well and rest. ER/Urgent care if symptoms worsen or do not improve. Patient verbalizes understanding and is agreeable.   Routing to provider for final review. Patient is agreeable to disposition. Will close encounter.

## 2017-07-19 NOTE — Telephone Encounter (Signed)
Spoke with patient. Seen in office this morning for post op, went to breakfast then home. Reports "heart burn" mid chest and felt cold, took pepto. "Heartburn" has improved, not resolved. Took temp, currently 100.5.  Denies SHOB, N/V, lightheadedness, dizziness, pressure, tightness, squeezing in chest.   Advised patient will review with Dr. Quincy Simmonds and return call. Should symptoms worsen or new symptoms develop, seek immediate evaluation at local ER.  Patient verbalizes understanding.   Dr. Quincy Simmonds -please review and advise?

## 2017-07-20 ENCOUNTER — Encounter: Payer: Self-pay | Admitting: Obstetrics and Gynecology

## 2017-07-20 ENCOUNTER — Telehealth: Payer: Self-pay | Admitting: *Deleted

## 2017-07-20 ENCOUNTER — Ambulatory Visit (INDEPENDENT_AMBULATORY_CARE_PROVIDER_SITE_OTHER): Payer: BC Managed Care – PPO | Admitting: Obstetrics and Gynecology

## 2017-07-20 VITALS — BP 110/70 | HR 72 | Temp 98.4°F | Resp 16 | Ht 64.0 in | Wt 203.0 lb

## 2017-07-20 DIAGNOSIS — R509 Fever, unspecified: Secondary | ICD-10-CM

## 2017-07-20 DIAGNOSIS — D72829 Elevated white blood cell count, unspecified: Secondary | ICD-10-CM

## 2017-07-20 LAB — CBC WITH DIFFERENTIAL/PLATELET
BASOS: 0 %
Basophils Absolute: 0 10*3/uL (ref 0.0–0.2)
EOS (ABSOLUTE): 0.2 10*3/uL (ref 0.0–0.4)
EOS: 2 %
HEMATOCRIT: 39.1 % (ref 34.0–46.6)
Hemoglobin: 12.1 g/dL (ref 11.1–15.9)
IMMATURE GRANS (ABS): 0 10*3/uL (ref 0.0–0.1)
IMMATURE GRANULOCYTES: 0 %
LYMPHS: 17 %
Lymphocytes Absolute: 1.9 10*3/uL (ref 0.7–3.1)
MCH: 28.5 pg (ref 26.6–33.0)
MCHC: 30.9 g/dL — ABNORMAL LOW (ref 31.5–35.7)
MCV: 92 fL (ref 79–97)
MONOS ABS: 0.6 10*3/uL (ref 0.1–0.9)
Monocytes: 6 %
NEUTROS PCT: 75 %
Neutrophils Absolute: 8.5 10*3/uL — ABNORMAL HIGH (ref 1.4–7.0)
PLATELETS: 279 10*3/uL (ref 150–379)
RBC: 4.25 x10E6/uL (ref 3.77–5.28)
RDW: 14.3 % (ref 12.3–15.4)
WBC: 11.2 10*3/uL — AB (ref 3.4–10.8)

## 2017-07-20 NOTE — Telephone Encounter (Signed)
-----   Message from Nunzio Cobbs, MD sent at 07/20/2017  8:44 AM EDT ----- Please contact patient regarding results of CBC with differential.  Her white blood cell count is slightly increased but it is coming down.  Please make an appointment if she is still having fever.

## 2017-07-20 NOTE — Telephone Encounter (Signed)
Call returned to patient to confirm if she was taking anything for fever. Last took motrin yesterday evening, did not have any tylenol at home, has not taken anything since.

## 2017-07-20 NOTE — Telephone Encounter (Signed)
Notes recorded by Burnice Logan, RN on 07/20/2017 at 10:42 AM EDT Spoke with patient, advised as seen below per Dr. Quincy Simmonds. Patient reports she feels "much better" today, no "heartburn", fever was 101.1 at 11pm last night, back to 99.0 at 8am this morning. Patient denies any other GYN symptoms or pain. No OV scheduled at this time. Patient advised to return call to office with any new symptoms or concerns. Will review with Dr. Quincy Simmonds and return call if any additional recommendations. Patient verbalizes understanding. See telephone encounter dated 07/20/17 to review with provider.   Routing to Dr. Quincy Simmonds.

## 2017-07-20 NOTE — Progress Notes (Signed)
GYNECOLOGY  VISIT   HPI: 57 y.o.   Divorced  Caucasian  female   G53P2 with Patient's last menstrual period was 11/06/2013 (approximate).   here for post op fever HYSTERECTOMY TOTAL LAPAROSCOPIC collection of pelvic washings. (N/A ) LAPAROSCOPIC SALPINGO OOPHORECTOMY (Bilateral ) CYSTOSCOPY (N/A ).  Had fever and chills yesterday.  In the afternoon it was 100.5 and then roses to 101.1 last hs at 9:00.  No known exposures.  No congesting, coughing, shortness of breath.  No pain with urination.  No pelvic pain.  No vaginal bleeding.  Right abdominal incision is sore and has been.  No leg pain or swelling.   I did a post op CBC on 07/19/17 and her WBC was 11.2 with a left shift. This was done in follow up to her post op day ) CBC done prior to same day discharge.  Her WBC then was 13.2, and this was attributed to post op inflammatory response.   Urine dip - negative.   GYNECOLOGIC HISTORY: Patient's last menstrual period was 11/06/2013 (approximate). Contraception:  Hysterectomy Menopausal hormone therapy:  none Last mammogram:  06/15/17 BIRADS 1 negative/denisty b Last pap smear:   06/06/17 Colposcopy showing atypia, HPV effect, and low grad dysplasia;3/22/19AGUS andEndometrial cells present; HR HPV not detected        OB History    Gravida  2   Para  2   Term      Preterm      AB      Living  2     SAB      TAB      Ectopic      Multiple      Live Births                 There are no active problems to display for this patient.   Past Medical History:  Diagnosis Date  . Abnormal uterine bleeding   . Asthma    Related to cats  . 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 Complication of anesthesia   . Left ovarian cyst 2019   septations  . PONV (postoperative nausea and vomiting)     Past Surgical History:  Procedure Laterality Date  . CYSTOSCOPY N/A  07/12/2017   Procedure: CYSTOSCOPY;  Surgeon: Nunzio Cobbs, MD;  Location: [facility];  Service: Gynecology;  Laterality: N/A;  . LAPAROSCOPIC HYSTERECTOMY N/A 07/12/2017   Procedure: HYSTERECTOMY TOTAL LAPAROSCOPIC collection  of pelvic washings.;  Surgeon: Nunzio Cobbs, MD;  Location: [facility];  Service: Gynecology;  Laterality: N/A;  Have alexis bag in room  . LAPAROSCOPIC SALPINGO OOPHERECTOMY Bilateral 07/12/2017   Procedure: LAPAROSCOPIC SALPINGO OOPHORECTOMY;  Surgeon: Nunzio Cobbs, MD;  Location: [facility];  Service: Gynecology;  Laterality: Bilateral;  . TUBAL LIGATION    . WISDOM TOOTH EXTRACTION      No current outpatient medications on file.   No current facility-administered medications for this visit.      ALLERGIES: Patient has no known allergies.  Family History  Problem Relation Age of Onset  . Congestive Heart Failure Mother   . Heart attack Father   . Cancer Brother        Dec 56 bile duct CA  . Cancer Maternal Grandmother        ?endometrial cancer  . Diabetes Brother   .  Diabetes Brother     Social History   Socioeconomic History  . Marital status: Divorced    Spouse name: Not on file  . Number of children: Not on file  . Years of education: Not on file  . Highest education level: Not on file  Occupational History  . Not on file  Social Needs  . Financial resource strain: Not on file  . Food insecurity:    Worry: Not on file    Inability: Not on file  . Transportation needs:    Medical: Not on file    Non-medical: Not on file  Tobacco Use  . Smoking status: Never Smoker  . Smokeless tobacco: Never Used  Substance and Sexual Activity  . Alcohol use: Yes    Alcohol/week: 1.2 oz    Types: 2 Glasses of wine per week    Comment: weekend only  . Drug use: Never  . Sexual activity: Yes    Birth control/protection: Surgical, Post-menopausal    Comment: Tubal   Lifestyle  . Physical activity:    Days per week: Not on file    Minutes per session: Not on file  . Stress: Not on file  Relationships  . Social connections:    Talks on phone: Not on file    Gets together: Not on file    Attends religious service: Not on file    Active member of club or organization: Not on file    Attends meetings of clubs or organizations: Not on file    Relationship status: Not on file  . Intimate partner violence:    Fear of current or ex partner: Not on file    Emotionally abused: Not on file    Physically abused: Not on file    Forced sexual activity: Not on file  Other Topics Concern  . Not on file  Social History Narrative  . Not on file    Review of Systems  Constitutional: Positive for chills and fever.  HENT: Negative.   Eyes: Negative.   Respiratory: Negative.   Cardiovascular: Negative.   Gastrointestinal: Negative.   Endocrine: Negative.   Genitourinary: Negative.   Musculoskeletal: Negative.   Skin: Negative.   Allergic/Immunologic: Negative.   Neurological: Negative.   Hematological: Negative.   Psychiatric/Behavioral: Negative.     PHYSICAL EXAMINATION:    BP 110/70 (BP Location: Right Arm, Patient Position: Sitting, Cuff Size: Large)   Pulse 72   Temp 98.4 F (36.9 C) (Oral)   Resp 16   Ht 5\' 4"  (1.626 m)   Wt 203 lb (92.1 kg)   LMP 11/06/2013 (Approximate)   BMI 34.84 kg/m     General appearance: alert, cooperative and appears stated age Head: Normocephalic, without obvious abnormality, atraumatic Lungs: clear to auscultation bilaterally Heart: regular rate and rhythm Abdomen: incisions clean, dry, and intact.  Abdomen is soft, non-tender, no masses,  no organomegaly Extremities: extremities normal, atraumatic, no cyanosis or edema   Pelvic: External genitalia:  no lesions              Urethra:  normal appearing urethra with no masses, tenderness or lesions              Bartholins and Skenes: normal                  Vagina: normal appearing vagina with normal color and discharge, no lesions              Cervix:  Absent.  Cuff  intact with no erythema, blood or drainage.                Bimanual Exam:  Uterus:  Absent.              Adnexa: no mass, fullness, tenderness              Rectal exam: Yes.  .  Confirms.              Anus:  normal sphincter tone, no lesions  Chaperone was present for exam.  ASSESSMENT  Post op fever of unknown origin.  No fever today.  Exam is nonfocal and clinically the patient looks well.  PLAN  We discussed potential causes for post op fever - atelectasis, urinary tract infection, skin infection/wound infection, DVT, and post op hematoma/infection.  We discussed signs and symptoms to watch for to diagnoses these conditions.  Will check a urine culture today.  If fever persists, will get a CT of the abdomen and pelvis. Keep appt for 6 week post op.   An After Visit Summary was printed and given to the patient.  __15____ minutes face to face time of which over 50% was spent in counseling.

## 2017-07-20 NOTE — Telephone Encounter (Signed)
Reviewed with Dr. Quincy Simmonds. Call returned to patient, OV scheduled for today at 1:15pm with Dr. Quincy Simmonds, aware will be worked into schedule. Patient states she has to find a driver, will return call to office if unable to find someone to drive her.   Routing to provider for final review. Patient is agreeable to disposition. Will close encounter.

## 2017-08-22 ENCOUNTER — Ambulatory Visit: Payer: BC Managed Care – PPO | Admitting: Obstetrics and Gynecology

## 2017-08-22 ENCOUNTER — Encounter: Payer: Self-pay | Admitting: Obstetrics and Gynecology

## 2017-08-22 ENCOUNTER — Other Ambulatory Visit: Payer: Self-pay

## 2017-08-22 VITALS — BP 110/70 | HR 66 | Ht 64.0 in | Wt 205.8 lb

## 2017-08-22 DIAGNOSIS — Z9889 Other specified postprocedural states: Secondary | ICD-10-CM

## 2017-08-22 DIAGNOSIS — D72829 Elevated white blood cell count, unspecified: Secondary | ICD-10-CM

## 2017-08-22 NOTE — Progress Notes (Signed)
GYNECOLOGY  VISIT   HPI: 57 y.o.   Divorced  Caucasian  female   G39P2 with Patient's last menstrual period was 11/06/2013 (approximate).   here for 6 week post op HYSTERECTOMY TOTAL LAPAROSCOPIC collection of pelvic washings. (N/A ) LAPAROSCOPIC SALPINGO OOPHORECTOMY (Bilateral ) CYSTOSCOPY (N/A ). 07/12/17.  States her lower back pain is now improved.   No vaginal bleeding or discharge.  No pelvic pain.  Normal energy level.  GYNECOLOGIC HISTORY: Patient's last menstrual period was 11/06/2013 (approximate). Contraception:  Hysterectomy.  Final pathology with negative cervical evaluation.  Menopausal hormone therapy:  none Last mammogram:  06/15/17 BIRADS 1 negative/denisty b Last pap smear:   06/06/17 Colposcopy showing atypia, HPV effect, and low grad dysplasia;3/22/19AGUS andEndometrial cells present; HR HPV not detected        OB History    Gravida  2   Para  2   Term      Preterm      AB      Living  2     SAB      TAB      Ectopic      Multiple      Live Births                 There are no active problems to display for this patient.   Past Medical History:  Diagnosis Date  . Abnormal uterine bleeding   . Asthma    Related to cats  . 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 Complication of anesthesia   . Left ovarian cyst 2019   septations  . PONV (postoperative nausea and vomiting)     Past Surgical History:  Procedure Laterality Date  . CYSTOSCOPY N/A 07/12/2017   Procedure: CYSTOSCOPY;  Surgeon: Nunzio Cobbs, MD;  Location: [facility];  Service: Gynecology;  Laterality: N/A;  . LAPAROSCOPIC HYSTERECTOMY N/A 07/12/2017   Procedure: HYSTERECTOMY TOTAL LAPAROSCOPIC collection  of pelvic washings.;  Surgeon: Nunzio Cobbs, MD;  Location: [facility];  Service: Gynecology;  Laterality: N/A;  Have alexis  bag in room  . LAPAROSCOPIC SALPINGO OOPHERECTOMY Bilateral 07/12/2017   Procedure: LAPAROSCOPIC SALPINGO OOPHORECTOMY;  Surgeon: Nunzio Cobbs, MD;  Location: [facility];  Service: Gynecology;  Laterality: Bilateral;  . TUBAL LIGATION    . WISDOM TOOTH EXTRACTION      No current outpatient medications on file.   No current facility-administered medications for this visit.      ALLERGIES: Patient has no known allergies.  Family History  Problem Relation Age of Onset  . Congestive Heart Failure Mother   . Heart attack Father   . Cancer Brother        Dec 56 bile duct CA  . Cancer Maternal Grandmother        ?endometrial cancer  . Diabetes Brother   . Diabetes Brother     Social History   Socioeconomic History  . Marital status: Divorced    Spouse name: Not on file  . Number of children: Not on file  . Years of education: Not on file  . Highest education level: Not on file  Occupational History  . Not on file  Social Needs  . Financial resource strain: Not on file  . Food insecurity:    Worry: Not on file    Inability: Not on file  .  Transportation needs:    Medical: Not on file    Non-medical: Not on file  Tobacco Use  . Smoking status: Never Smoker  . Smokeless tobacco: Never Used  Substance and Sexual Activity  . Alcohol use: Yes    Alcohol/week: 1.2 oz    Types: 2 Glasses of wine per week    Comment: weekend only  . Drug use: Never  . Sexual activity: Yes    Birth control/protection: Surgical, Post-menopausal    Comment: Tubal  Lifestyle  . Physical activity:    Days per week: Not on file    Minutes per session: Not on file  . Stress: Not on file  Relationships  . Social connections:    Talks on phone: Not on file    Gets together: Not on file    Attends religious service: Not on file    Active member of club or organization: Not on file    Attends meetings of clubs or organizations: Not on file    Relationship status:  Not on file  . Intimate partner violence:    Fear of current or ex partner: Not on file    Emotionally abused: Not on file    Physically abused: Not on file    Forced sexual activity: Not on file  Other Topics Concern  . Not on file  Social History Narrative  . Not on file    Review of Systems  Constitutional: Negative.   HENT: Negative.   Eyes: Negative.   Respiratory: Negative.   Cardiovascular: Negative.   Gastrointestinal: Negative.   Endocrine: Negative.   Genitourinary: Negative.   Musculoskeletal: Negative.   Skin: Negative.   Allergic/Immunologic: Negative.   Hematological: Negative.   Psychiatric/Behavioral: Negative.     PHYSICAL EXAMINATION:    BP 110/70 (BP Location: Right Arm, Patient Position: Sitting, Cuff Size: Large)   Pulse 66   Ht 5\' 4"  (1.626 m)   Wt 205 lb 12.8 oz (93.4 kg)   LMP 11/06/2013 (Approximate)   BMI 35.33 kg/m     General appearance: alert, cooperative and appears stated age   Abdomen: incisions intact - suture material clipped from left upper incision.  Abdomen is soft, non-tender, no masses,  no organomegaly   Pelvic: External genitalia:  no lesions              Urethra:  normal appearing urethra with no masses, tenderness or lesions              Bartholins and Skenes: normal                 Vagina: normal appearing vagina with normal color and discharge, no lesions              Cervix:  Absent.  Suture still present left apex.                 Bimanual Exam:  Uterus:   absent              Adnexa: no mass, fullness, tenderness                Chaperone was present for exam.  ASSESSMENT  Status post laparoscopic hysterectomy with BSO.  Doing well pos top.  Leukocytosis post op.   PLAN  Continue pelvic rest until 09/11/17.  Check CBC with diff.  OK to return to work tomorrow.  Annual exam yearly 5/7.  Return also prn.    An After Visit Summary was printed and  given to the patient.

## 2017-08-23 LAB — CBC WITH DIFFERENTIAL/PLATELET
BASOS: 0 %
Basophils Absolute: 0 10*3/uL (ref 0.0–0.2)
EOS (ABSOLUTE): 0.3 10*3/uL (ref 0.0–0.4)
EOS: 4 %
HEMATOCRIT: 36.6 % (ref 34.0–46.6)
Hemoglobin: 12 g/dL (ref 11.1–15.9)
IMMATURE GRANULOCYTES: 0 %
Immature Grans (Abs): 0 10*3/uL (ref 0.0–0.1)
LYMPHS ABS: 2.1 10*3/uL (ref 0.7–3.1)
Lymphs: 34 %
MCH: 29.1 pg (ref 26.6–33.0)
MCHC: 32.8 g/dL (ref 31.5–35.7)
MCV: 89 fL (ref 79–97)
MONOS ABS: 0.3 10*3/uL (ref 0.1–0.9)
Monocytes: 5 %
NEUTROS ABS: 3.4 10*3/uL (ref 1.4–7.0)
Neutrophils: 57 %
Platelets: 236 10*3/uL (ref 150–450)
RBC: 4.12 x10E6/uL (ref 3.77–5.28)
RDW: 13.8 % (ref 12.3–15.4)
WBC: 6 10*3/uL (ref 3.4–10.8)

## 2018-07-14 ENCOUNTER — Ambulatory Visit: Payer: BC Managed Care – PPO | Admitting: Obstetrics and Gynecology

## 2020-11-26 ENCOUNTER — Other Ambulatory Visit: Payer: Self-pay | Admitting: Obstetrics and Gynecology

## 2020-11-26 DIAGNOSIS — Z1231 Encounter for screening mammogram for malignant neoplasm of breast: Secondary | ICD-10-CM

## 2020-11-26 NOTE — Progress Notes (Signed)
60 y.o. G2P2 Divorced Caucasian female here for annual exam.    Recent break up of a long term relationship.  Accepts STD screening today.   Wants routine labs and testing for DM, which runs in the family.  PCP:   None  Patient's last menstrual period was 11/06/2013 (approximate).           Sexually active: No. Just recently got out of a relationship The current method of family planning is tubal ligation/Hyst.    Exercising: Yes.     Walks 10,000 steps dail Smoker:  no  Health Maintenance: Pap: 05-27-17 AGUS:Neg HR HPV History of abnormal Pap:  yes, 06-06-17 colpo revealed atypia, HPV effect and LGSIL. 05-27-17 pap AGUS:Neg HR HPV. 06-02-17 EMB -- benign polyp. MMG:  06-15-17 Neg/BiRads1. Appt. 12-01-20 Colonoscopy:  NEVER BMD:   N/A  Result  N/A TDaP: 05-27-17 Gardasil:   no HIV: Neg in the past Hep C:Neg in the past Screening Labs:  today. Covid vaccine series completed.   reports that she has never smoked. She has never used smokeless tobacco. She reports current alcohol use of about 4.0 standard drinks per week. She reports that she does not use drugs.  Past Medical History:  Diagnosis Date   Abnormal uterine bleeding    Asthma    Related to cats   Atypical glandular cells of undetermined significance (AGUS) on cervical Pap smear 2019   Polypoid endomerium, cervical atypia   Complication of anesthesia    Left ovarian cyst 2019   septations   PONV (postoperative nausea and vomiting)     Past Surgical History:  Procedure Laterality Date   CYSTOSCOPY N/A 07/12/2017   Procedure: CYSTOSCOPY;  Surgeon: Nunzio Cobbs, MD;  Location: [facility];  Service: Gynecology;  Laterality: N/A;   LAPAROSCOPIC HYSTERECTOMY N/A 07/12/2017   Procedure: HYSTERECTOMY TOTAL LAPAROSCOPIC collection  of pelvic washings.;  Surgeon: Nunzio Cobbs, MD;  Location: [facility];  Service: Gynecology;  Laterality: N/A;  Have alexis bag in room    LAPAROSCOPIC SALPINGO OOPHERECTOMY Bilateral 07/12/2017   Procedure: LAPAROSCOPIC SALPINGO OOPHORECTOMY;  Surgeon: Nunzio Cobbs, MD;  Location: [facility];  Service: Gynecology;  Laterality: Bilateral;   TUBAL LIGATION     WISDOM TOOTH EXTRACTION      No current outpatient medications on file.   No current facility-administered medications for this visit.    Family History  Problem Relation Age of Onset   Congestive Heart Failure Mother    Heart attack Father    Cancer Brother        Dec 56 bile duct CA   Cancer Maternal Grandmother        ?endometrial cancer   Diabetes Brother    Diabetes Brother     Review of Systems  All other systems reviewed and are negative.  Exam:   BP 118/70   Pulse 78   Ht 5\' 4"  (1.626 m)   Wt 209 lb (94.8 kg)   LMP 11/06/2013 (Approximate)   SpO2 98%   BMI 35.87 kg/m     General appearance: alert, cooperative and appears stated age Head: normocephalic, without obvious abnormality, atraumatic Neck: no adenopathy, supple, symmetrical, trachea midline and thyroid normal to inspection and palpation Lungs: clear to auscultation bilaterally Breasts: normal appearance, no masses or tenderness, No nipple retraction or dimpling, No nipple discharge or bleeding, No axillary adenopathy Heart: regular rate and rhythm Abdomen: soft, non-tender; no masses,  no organomegaly Extremities: extremities normal, atraumatic, no cyanosis or edema Skin: skin color, texture, turgor normal. No rashes or lesions Lymph nodes: cervical, supraclavicular, and axillary nodes normal. Neurologic: grossly normal  Pelvic: External genitalia:  no lesions              No abnormal inguinal nodes palpated.              Urethra:  normal appearing urethra with no masses, tenderness or lesions              Bartholins and Skenes: normal                 Vagina: normal appearing vagina with normal color and discharge, no lesions              Cervix: absent               Pap taken: yes Bimanual Exam:  Uterus:  absent              Adnexa: no mass, fullness, tenderness              Rectal exam: yes.  Confirms.              Anus:  normal sphincter tone, no lesions  Chaperone was present for exam:  Raquel Sarna, RN  Assessment:   Well woman visit with gynecologic exam. Status post laparoscopic hysterectomy with BSO and cystoscopy.  Final pathology - benign endometrial polyp, fibroids, benign cervix, and benign mucinous cystadenoma of left ovary.  Benign pelvic washings. Hx AGUS pap and LGSIL on colposcopic biopsy prior to hysterectomy.  FH DM.   Plan: Mammogram screening discussed. Self breast awareness reviewed. Pap and HR HPV collected. Guidelines for Calcium, Vitamin D, regular exercise program including cardiovascular and weight bearing exercise. Routine labs and A1C. STD screening.  She will establish care with a PCP.   We discussed options for colon cancer screening.   She will consider them. Follow up annually and prn.   After visit summary provided.

## 2020-11-27 ENCOUNTER — Encounter: Payer: Self-pay | Admitting: Obstetrics and Gynecology

## 2020-11-27 ENCOUNTER — Other Ambulatory Visit (HOSPITAL_COMMUNITY)
Admission: RE | Admit: 2020-11-27 | Discharge: 2020-11-27 | Disposition: A | Discharge status: Home / Self Care | Payer: BC Managed Care – PPO | Source: Ambulatory Visit | Attending: Obstetrics and Gynecology | Admitting: Obstetrics and Gynecology

## 2020-11-27 ENCOUNTER — Ambulatory Visit (INDEPENDENT_AMBULATORY_CARE_PROVIDER_SITE_OTHER): Payer: BC Managed Care – PPO | Admitting: Obstetrics and Gynecology

## 2020-11-27 ENCOUNTER — Other Ambulatory Visit: Payer: Self-pay

## 2020-11-27 VITALS — BP 118/70 | HR 78 | Ht 64.0 in | Wt 209.0 lb

## 2020-11-27 DIAGNOSIS — Z01419 Encounter for gynecological examination (general) (routine) without abnormal findings: Secondary | ICD-10-CM | POA: Diagnosis not present

## 2020-11-27 DIAGNOSIS — Z113 Encounter for screening for infections with a predominantly sexual mode of transmission: Secondary | ICD-10-CM

## 2020-11-27 NOTE — Patient Instructions (Signed)

## 2020-11-28 LAB — COMPREHENSIVE METABOLIC PANEL
AG Ratio: 1.6 (calc) (ref 1.0–2.5)
ALT: 14 U/L (ref 6–29)
AST: 13 U/L (ref 10–35)
Albumin: 4.2 g/dL (ref 3.6–5.1)
Alkaline phosphatase (APISO): 46 U/L (ref 37–153)
BUN: 19 mg/dL (ref 7–25)
CO2: 25 mmol/L (ref 20–32)
Calcium: 9.1 mg/dL (ref 8.6–10.4)
Chloride: 108 mmol/L (ref 98–110)
Creat: 0.75 mg/dL (ref 0.50–1.03)
Globulin: 2.6 g/dL (calc) (ref 1.9–3.7)
Glucose, Bld: 112 mg/dL — ABNORMAL HIGH (ref 65–99)
Potassium: 4 mmol/L (ref 3.5–5.3)
Sodium: 142 mmol/L (ref 135–146)
Total Bilirubin: 0.4 mg/dL (ref 0.2–1.2)
Total Protein: 6.8 g/dL (ref 6.1–8.1)

## 2020-11-28 LAB — HEPATITIS C ANTIBODY
Hepatitis C Ab: NONREACTIVE
SIGNAL TO CUT-OFF: 0 (ref <1.00)

## 2020-11-28 LAB — HIV ANTIBODY (ROUTINE TESTING W REFLEX): HIV 1&2 Ab, 4th Generation: NONREACTIVE

## 2020-11-28 LAB — CBC
HCT: 38.5 % (ref 35.0–45.0)
Hemoglobin: 12.5 g/dL (ref 11.7–15.5)
MCH: 29.7 pg (ref 27.0–33.0)
MCHC: 32.5 g/dL (ref 32.0–36.0)
MCV: 91.4 fL (ref 80.0–100.0)
MPV: 10.7 fL (ref 7.5–12.5)
Platelets: 281 10*3/uL (ref 140–400)
RBC: 4.21 10*6/uL (ref 3.80–5.10)
RDW: 12.6 % (ref 11.0–15.0)
WBC: 7.1 10*3/uL (ref 3.8–10.8)

## 2020-11-28 LAB — SURESWAB CT/NG/T. VAGINALIS
C. trachomatis RNA, TMA: NOT DETECTED
N. gonorrhoeae RNA, TMA: NOT DETECTED
Trichomonas vaginalis RNA: NOT DETECTED

## 2020-11-28 LAB — LIPID PANEL
Cholesterol: 196 mg/dL (ref <200)
HDL: 78 mg/dL (ref >=50)
LDL Cholesterol (Calc): 90 mg/dL (calc)
Non-HDL Cholesterol (Calc): 118 mg/dL (calc) (ref <130)
Total CHOL/HDL Ratio: 2.5 (calc) (ref <5.0)
Triglycerides: 181 mg/dL — ABNORMAL HIGH (ref <150)

## 2020-11-28 LAB — HEMOGLOBIN A1C
Hgb A1c MFr Bld: 5.4 % of total Hgb (ref <5.7)
Mean Plasma Glucose: 108 mg/dL
eAG (mmol/L): 6 mmol/L

## 2020-11-28 LAB — HEPATITIS B SURFACE ANTIGEN: Hepatitis B Surface Ag: NONREACTIVE

## 2020-11-28 LAB — RPR: RPR Ser Ql: NONREACTIVE

## 2020-12-01 ENCOUNTER — Ambulatory Visit
Admission: RE | Admit: 2020-12-01 | Discharge: 2020-12-01 | Disposition: A | Discharge status: Home / Self Care | Payer: BC Managed Care – PPO | Source: Ambulatory Visit

## 2020-12-01 ENCOUNTER — Other Ambulatory Visit: Payer: Self-pay

## 2020-12-01 DIAGNOSIS — Z1231 Encounter for screening mammogram for malignant neoplasm of breast: Secondary | ICD-10-CM

## 2020-12-01 LAB — CYTOLOGY - PAP
Comment: NEGATIVE
Diagnosis: NEGATIVE
Diagnosis: REACTIVE
High risk HPV: POSITIVE — AB

## 2020-12-04 ENCOUNTER — Other Ambulatory Visit: Payer: Self-pay | Admitting: *Deleted

## 2020-12-04 DIAGNOSIS — R87810 Cervical high risk human papillomavirus (HPV) DNA test positive: Secondary | ICD-10-CM

## 2020-12-18 NOTE — Progress Notes (Signed)
   Subjective:     Patient ID: Hal Hope, female   DOB: 09-26-1960, 60 y.o.   MRN: [id number]  HPI Patient here today for colposcopy with pap 11-27-17 Neg:Pos HR HPV.  Pap history:  11-27-17 Neg:Pos HR HPV 06-06-17 colpo revealed atypia, HPV effect and LGSIL. 06-02-17 EMB -- benign polyp. 05-27-17 AGUS:Neg HR HPV  Laparoscopic hysterectomy 07/12/17.  Final pathology benign cervix.   Review of Systems  All other systems reviewed and are negative. GBE:EFEO Contraception:  Hyst/Tubal     Objective:   Physical Exam  Colposcopy Consent for procedure. Acetic acid placed in vagina.  Cervix absent. No acetowhite lesions noted.  Lugol's placed.   Decreased uptake at right and left vaginal apices.  Biopsies done of the right and left vaginal apex and sent to pathology separately.  Monsel's placed.  Minimal EBL. No complications.     Assessment:     Positive HR HPV of vagina.  Hx AGUS pap and LGSIL prior to hysterectomy. Status post hysterectomy with benign cervix.     Plan:     FU biopsies.  Anticipate pap and HR HPV in one year.

## 2020-12-22 ENCOUNTER — Ambulatory Visit: Payer: BC Managed Care – PPO | Admitting: Obstetrics and Gynecology

## 2020-12-22 ENCOUNTER — Encounter: Payer: Self-pay | Admitting: Obstetrics and Gynecology

## 2020-12-22 ENCOUNTER — Other Ambulatory Visit (HOSPITAL_COMMUNITY)
Admission: RE | Admit: 2020-12-22 | Discharge: 2020-12-22 | Disposition: A | Discharge status: Home / Self Care | Payer: BC Managed Care – PPO | Source: Ambulatory Visit | Attending: Obstetrics and Gynecology | Admitting: Obstetrics and Gynecology

## 2020-12-22 ENCOUNTER — Other Ambulatory Visit: Payer: Self-pay

## 2020-12-22 DIAGNOSIS — R87811 Vaginal high risk human papillomavirus (HPV) DNA test positive: Secondary | ICD-10-CM | POA: Diagnosis not present

## 2020-12-22 DIAGNOSIS — R87810 Cervical high risk human papillomavirus (HPV) DNA test positive: Secondary | ICD-10-CM | POA: Diagnosis present

## 2020-12-22 NOTE — Patient Instructions (Signed)
Colposcopy, Care After This sheet gives you information about how to care for yourself after your procedure. Your health care provider may also give you more specific instructions. If you have problems or questions, contact your health care provider. What can I expect after the procedure? If you had a colposcopy without a biopsy, you can expect to feel fine right away after your procedure. However, you may have some spotting of blood for a few days. You can return to your normal activities. If you had a colposcopy with a biopsy, it is common after the procedure to have: Soreness and mild pain. These may last for a few days. Light-headedness. Mild vaginal bleeding or discharge that is dark-colored and grainy. This may last for a few days. The discharge may be caused by a liquid (solution) that was used during the procedure. You may need to wear a sanitary pad during this time. Spotting of blood for at least 48 hours after the procedure. Follow these instructions at home: Medicines Take over-the-counter and prescription medicines only as told by your health care provider. Talk with your health care provider about what type of over-the-counter pain medicine and prescription medicine you can start to take again. It is especially important to talk with your health care provider if you take blood thinners. Activity Limit your physical activity for the first day after your procedure as told by your health care provider. Avoid using douche products, using tampons, or having sex for at least 3 days after the procedure or for as long as told. Return to your normal activities as told by your health care provider. Ask your health care provider what activities are safe for you. General instructions  Drink enough fluid to keep your urine pale yellow. Ask your health care provider if you may take baths, swim, or use a hot tub. You may take showers. If you use birth control (contraception), continue to use  it. Keep all follow-up visits as told by your health care provider. This is important. Contact a health care provider if: You develop a skin rash. Get help right away if: You bleed a lot from your vagina or pass blood clots. This includes using more than one sanitary pad each hour for 2 hours in a row. You have a fever or chills. You have vaginal discharge that is abnormal, is yellow in color, or smells bad. This could be a sign of infection. You have severe pain or cramps in your lower abdomen that do not go away with medicine. You faint. Summary If you had a colposcopy without a biopsy, you can expect to feel fine right away, but you may have some spotting of blood for a few days. You can return to your normal activities. If you had a colposcopy with a biopsy, it is common to have mild pain for a few days and spotting for 48 hours after the procedure. Avoid using douche products, using tampons, and having sex for at least 3 days after the procedure or for as long as told by your health care provider. Get help right away if you have heavy bleeding, severe pain, or signs of infection. This information is not intended to replace advice given to you by your health care provider. Make sure you discuss any questions you have with your health care provider. Document Revised: 02/21/2019 Document Reviewed: 02/21/2019 Elsevier Patient Education  2022 [employer].

## 2020-12-24 LAB — SURGICAL PATHOLOGY

## 2020-12-25 NOTE — Progress Notes (Signed)
Left detailed message on patient voicemail per DRP access.

## 2022-11-26 IMAGING — MG MM DIGITAL SCREENING BILAT W/ TOMO AND CAD
8 series · 8 of 24 positions shown · non-contrast
Comparison: Previous exam(s).

CLINICAL DATA: Screening.

EXAM:
DIGITAL SCREENING BILATERAL MAMMOGRAM WITH TOMOSYNTHESIS AND CAD
TECHNIQUE: Bilateral screening digital craniocaudal and mediolateral oblique
mammograms were obtained. Bilateral screening digital breast
tomosynthesis was performed. The images were evaluated with
computer-aided detection.

[L CC synth-2D]
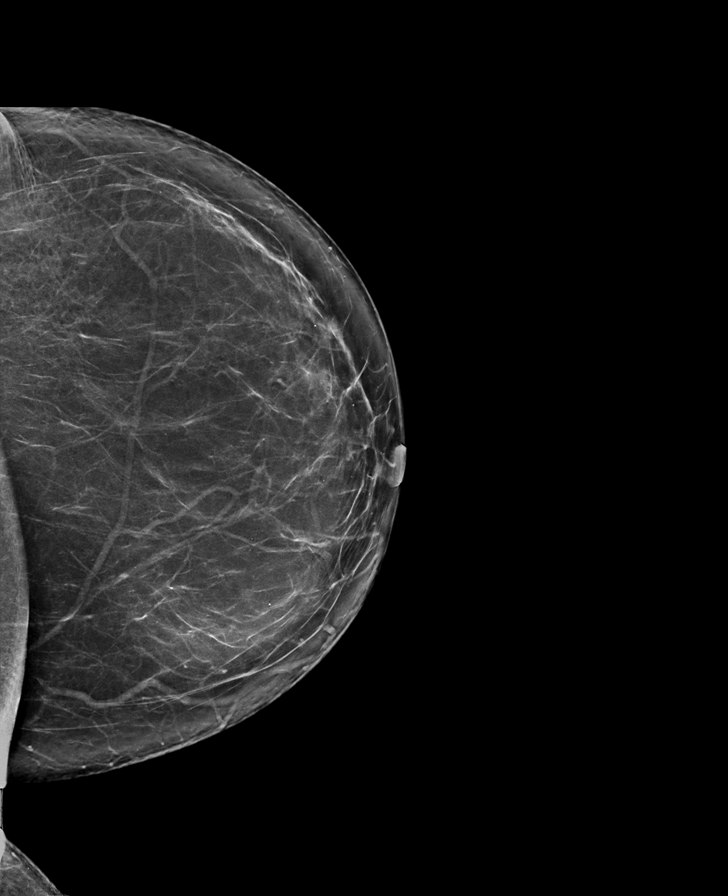

[R CC synth-2D]
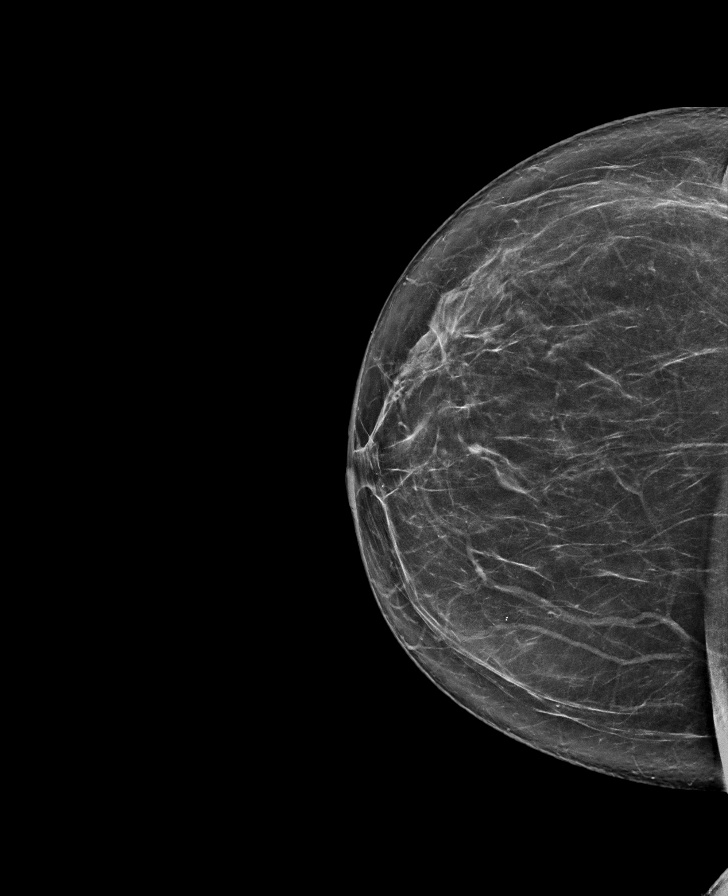

[R MLO synth-2D]
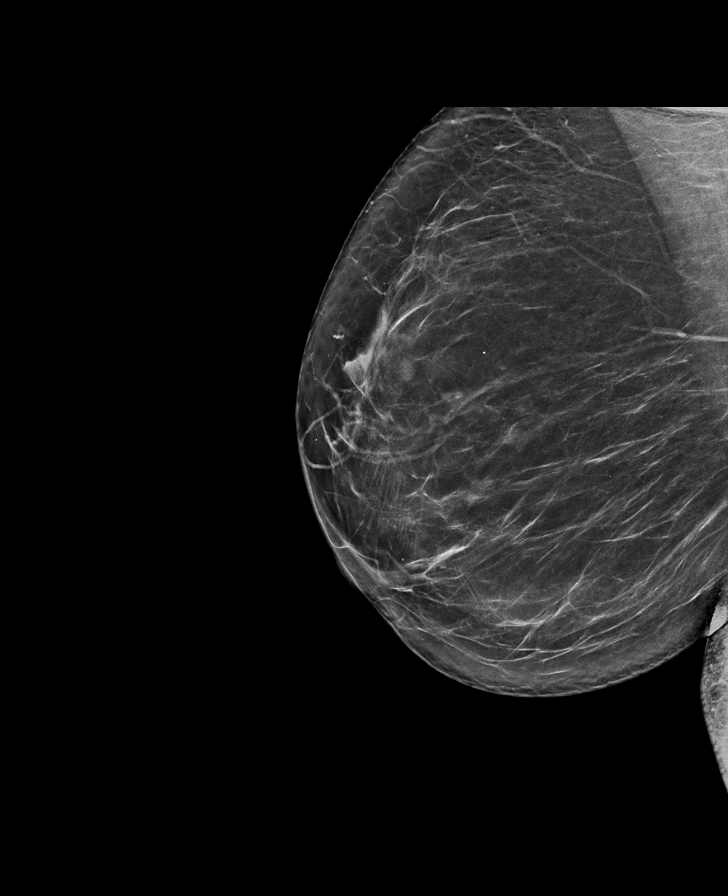

[L MLO synth-2D]
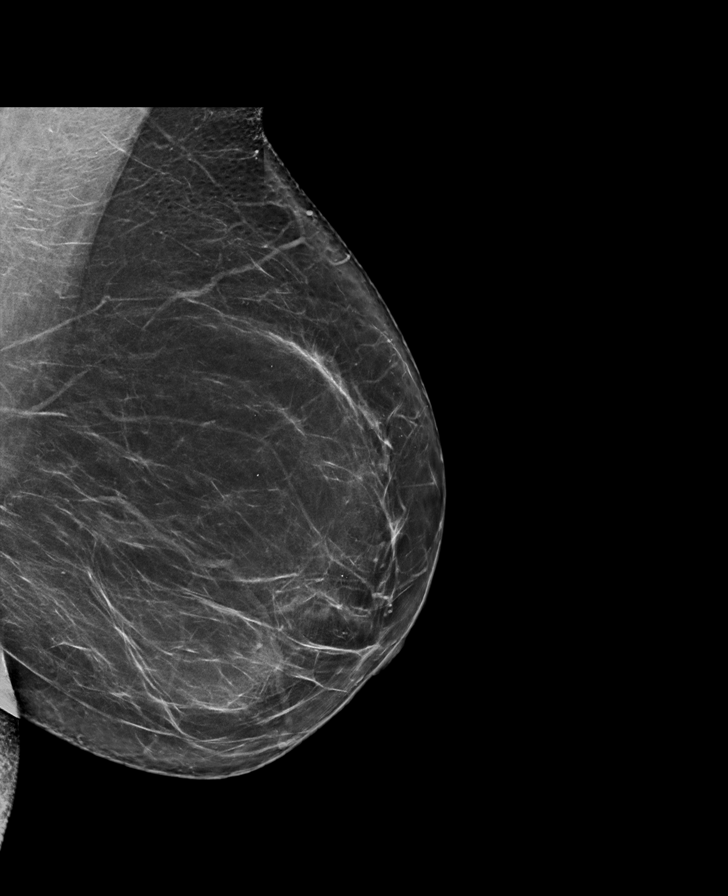

[L MLO tomo · tomo slice 43/85.0]
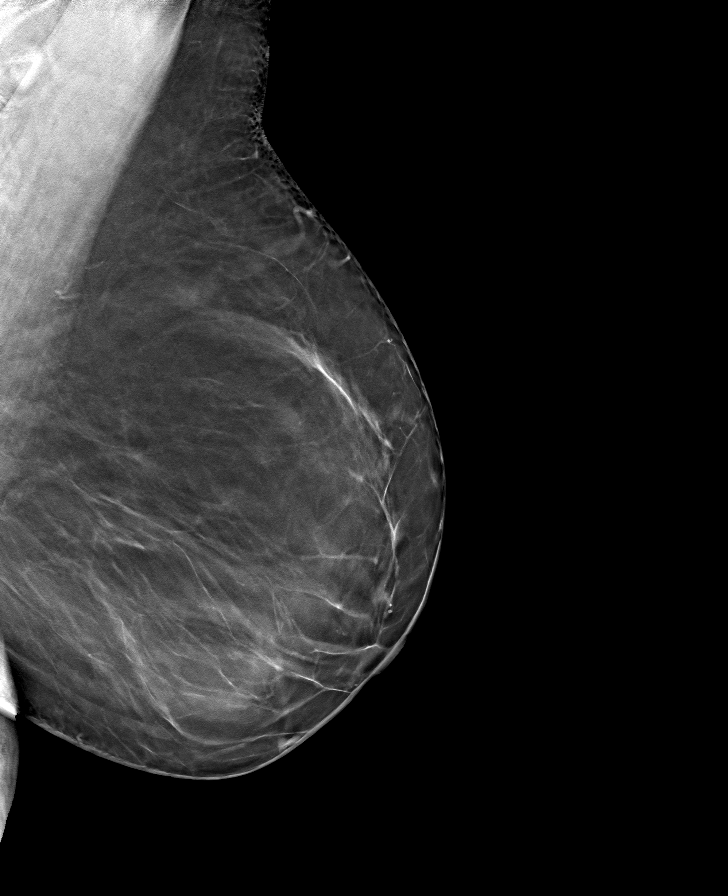

[R CC tomo · tomo slice 39/78.0]
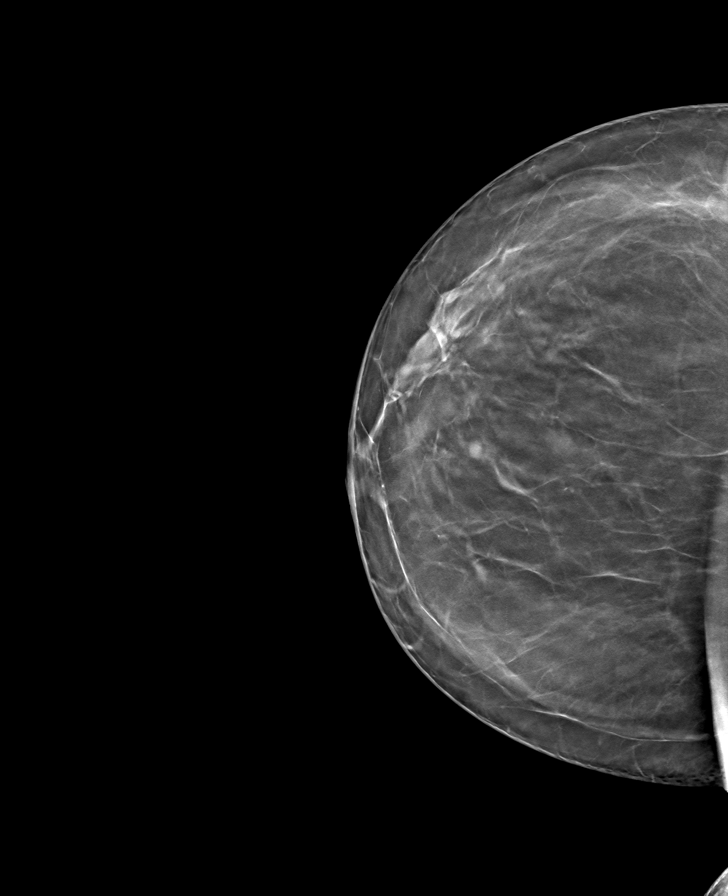

[R MLO tomo · tomo slice 43/84.0]
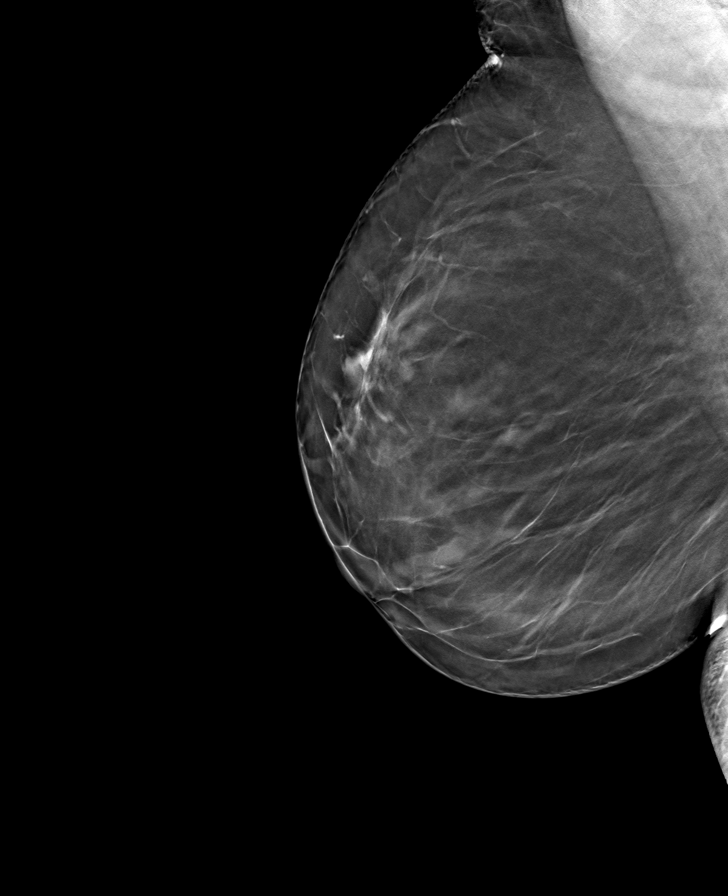

[L CC tomo · tomo slice 42/83.0]
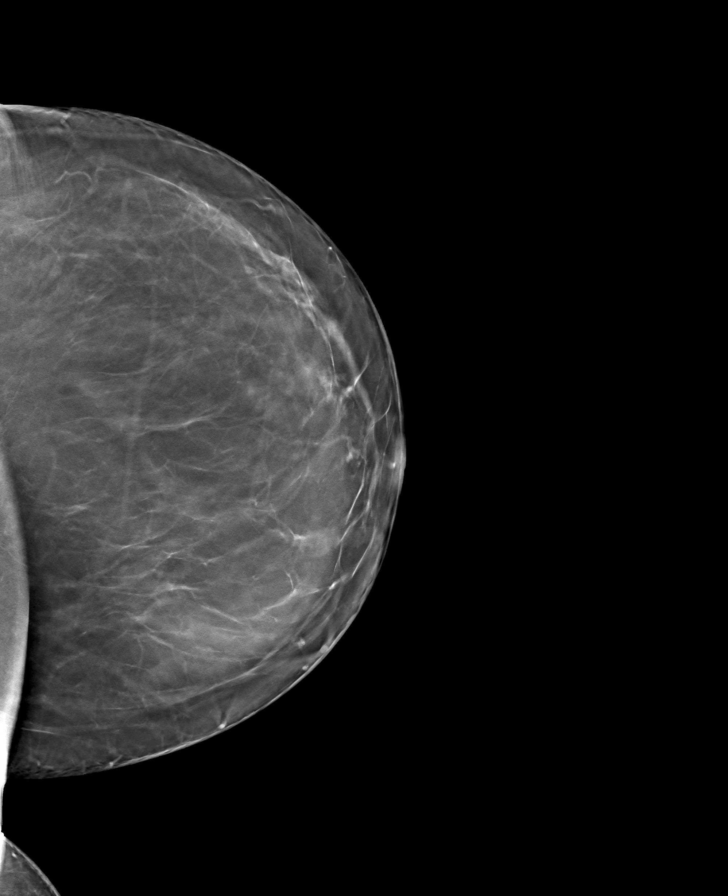

[8 of 24 positions shown; findings below may reference images not displayed]

ACR Breast Density Category b: There are scattered areas of
fibroglandular density.
FINDINGS: There are no findings suspicious for malignancy.
IMPRESSION: No mammographic evidence of malignancy. A result letter of this
screening mammogram will be mailed directly to the patient.

RECOMMENDATION:
Screening mammogram in one year. (Code:51-O-LD2)

BI-RADS CATEGORY  1: Negative.
